# Patient Record
Sex: Male | Born: 1950 | Race: White | Hispanic: No | Marital: Married | Smoking: Never smoker
Health system: Southern US, Community
[De-identification: ages and names within clinical notes are randomized; demographics above are authoritative.]

## PROBLEM LIST (undated history)

## (undated) DIAGNOSIS — T7840XA Allergy, unspecified, initial encounter: Secondary | ICD-10-CM

## (undated) DIAGNOSIS — K222 Esophageal obstruction: Secondary | ICD-10-CM

## (undated) DIAGNOSIS — C4490 Unspecified malignant neoplasm of skin, unspecified: Secondary | ICD-10-CM

## (undated) DIAGNOSIS — F439 Reaction to severe stress, unspecified: Secondary | ICD-10-CM

## (undated) DIAGNOSIS — E785 Hyperlipidemia, unspecified: Secondary | ICD-10-CM

## (undated) DIAGNOSIS — D509 Iron deficiency anemia, unspecified: Secondary | ICD-10-CM

## (undated) DIAGNOSIS — I728 Aneurysm of other specified arteries: Secondary | ICD-10-CM

## (undated) DIAGNOSIS — K219 Gastro-esophageal reflux disease without esophagitis: Secondary | ICD-10-CM

## (undated) DIAGNOSIS — F419 Anxiety disorder, unspecified: Secondary | ICD-10-CM

## (undated) DIAGNOSIS — I10 Essential (primary) hypertension: Secondary | ICD-10-CM

## (undated) DIAGNOSIS — K52839 Microscopic colitis, unspecified: Secondary | ICD-10-CM

## (undated) HISTORY — PX: UPPER GASTROINTESTINAL ENDOSCOPY: SHX188

## (undated) HISTORY — DX: Gastro-esophageal reflux disease without esophagitis: K21.9

## (undated) HISTORY — DX: Iron deficiency anemia, unspecified: D50.9

## (undated) HISTORY — PX: HAND RECONSTRUCTION: SHX1730

## (undated) HISTORY — DX: Allergy, unspecified, initial encounter: T78.40XA

## (undated) HISTORY — DX: Hyperlipidemia, unspecified: E78.5

## (undated) HISTORY — PX: COSMETIC SURGERY: SHX468

## (undated) HISTORY — DX: Esophageal obstruction: K22.2

## (undated) HISTORY — DX: Essential (primary) hypertension: I10

## (undated) HISTORY — DX: Microscopic colitis, unspecified: K52.839

## (undated) HISTORY — DX: Aneurysm of other specified arteries: I72.8

## (undated) HISTORY — DX: Reaction to severe stress, unspecified: F43.9

## (undated) HISTORY — PX: COLONOSCOPY: SHX174

## (undated) HISTORY — DX: Anxiety disorder, unspecified: F41.9

## (undated) HISTORY — PX: OTHER SURGICAL HISTORY: SHX169

## (undated) HISTORY — DX: Unspecified malignant neoplasm of skin, unspecified: C44.90

## (undated) HISTORY — PX: MENISCUS REPAIR: SHX5179

---

## 2009-07-25 ENCOUNTER — Encounter: Payer: Self-pay | Admitting: Internal Medicine

## 2009-08-25 ENCOUNTER — Ambulatory Visit: Payer: Self-pay | Admitting: Internal Medicine

## 2009-08-25 DIAGNOSIS — I10 Essential (primary) hypertension: Secondary | ICD-10-CM | POA: Insufficient documentation

## 2009-08-25 DIAGNOSIS — G4700 Insomnia, unspecified: Secondary | ICD-10-CM | POA: Insufficient documentation

## 2009-08-25 DIAGNOSIS — E785 Hyperlipidemia, unspecified: Secondary | ICD-10-CM | POA: Insufficient documentation

## 2009-10-06 ENCOUNTER — Ambulatory Visit: Payer: Self-pay | Admitting: Internal Medicine

## 2009-12-20 ENCOUNTER — Ambulatory Visit: Payer: Self-pay | Admitting: Internal Medicine

## 2009-12-20 DIAGNOSIS — K219 Gastro-esophageal reflux disease without esophagitis: Secondary | ICD-10-CM | POA: Insufficient documentation

## 2009-12-20 DIAGNOSIS — R109 Unspecified abdominal pain: Secondary | ICD-10-CM | POA: Insufficient documentation

## 2010-02-03 ENCOUNTER — Telehealth: Payer: Self-pay | Admitting: Internal Medicine

## 2010-04-20 ENCOUNTER — Ambulatory Visit: Payer: Self-pay | Admitting: Internal Medicine

## 2010-05-30 NOTE — Assessment & Plan Note (Signed)
Summary: 6 WEEK FOLLOW UP/CJR  in a oneAnd a  Vital Signs:  Patient profile:   60 year old [name] Weight:      202 pounds Temp:     98.1 degrees F oral BP sitting:   102 / 70  (left arm) Cuff size:   regular  Vitals Entered By: Duard Brady LPN (October 06, 1608 8:11 AM) CC: 6 wk rov - doing well Is Patient Diabetic? No   CC:  6 wk rov - doing well.  History of Present Illness: 60 year old patient who is seen today for follow-up of his hypertension.  He was placed on sertraline last visit due to some situational stress anger issues and insomnia.  He has had a traffic clinical response and feels quite well.  He has been on Benicar and in the past has been on Lotensin.  He was changed due to some nonspecific throat issues.  He also remains on Vytorin  Preventive Screening-Counseling & Management  Alcohol-Tobacco     Smoking Status: never  Allergies: 1)  ! Pcn  Past History:  Past Medical History: Reviewed history from 08/25/2009 and no changes required. Hyperlipidemia Hypertension situational stress  Past Surgical History: Reviewed history from 08/25/2009 and no changes required. Cardiolite stress test, fall 2010 colonoscopy upper endoscopy, fall 2010  Review of Systems  The patient denies anorexia, fever, weight loss, weight gain, vision loss, decreased hearing, hoarseness, chest pain, syncope, dyspnea on exertion, peripheral edema, prolonged cough, headaches, hemoptysis, abdominal pain, melena, hematochezia, severe indigestion/heartburn, hematuria, incontinence, genital sores, muscle weakness, suspicious skin lesions, transient blindness, difficulty walking, depression, unusual weight change, abnormal bleeding, enlarged lymph nodes, angioedema, breast masses, and testicular masses.    Physical Exam  General:  Well-developed,well-nourished,in no acute distress; alert,appropriate and cooperative throughout examination;  blood  pressure 104/70   Impression &  Recommendations:  Problem # 1:  INSOMNIA (ICD-780.52)  The following medications were removed from the medication list:    Zolpidem Tartrate 10 Mg Tabs (Zolpidem tartrate) .Marland Kitchen... As needed at bedtime His updated medication list for this problem includes:    Triazolam 0.25 Mg Tabs (Triazolam) ..... One at bedtime as needed for sleep    The following medications were removed from the medication list:    Zolpidem Tartrate 10 Mg Tabs (Zolpidem tartrate) .Marland Kitchen... As needed at bedtime His updated medication list for this problem includes:    Triazolam 0.25 Mg Tabs (Triazolam) ..... One at bedtime as needed for sleep  Problem # 2:  HYPERTENSION (ICD-401.9)  The following medications were removed from the medication list:    Benicar 20 Mg Tabs (Olmesartan medoxomil) ..... Once daily His updated medication list for this problem includes:    Lisinopril 20 Mg Tabs (Lisinopril) ..... One daily    The following medications were removed from the medication list:    Benicar 20 Mg Tabs (Olmesartan medoxomil) ..... Once daily His updated medication list for this problem includes:    Lisinopril 20 Mg Tabs (Lisinopril) ..... One daily  Complete Medication List: 1)  Niaspan 1000 Mg Cr-tabs (Niacin (antihyperlipidemic)) .... Once daily 2)  Aciphex 20 Mg Tbec (Rabeprazole sodium) .... Once daily 3)  Sertraline Hcl 50 Mg Tabs (Sertraline hcl) .... One every am 4)  Lisinopril 20 Mg Tabs (Lisinopril) .... One daily 5)  Triazolam 0.25 Mg Tabs (Triazolam) .... One at bedtime as needed for sleep 6)  Simvastatin 40 Mg Tabs (Simvastatin) .... One daily  Other Orders: Prescription Created Electronically [id number])  Patient Instructions: 1)  Please schedule a follow-up appointment in 6 months. 2)  Limit your Sodium (Salt). 3)  It is important that you exercise regularly at least 20 minutes 5 times a week. If you develop chest pain, have severe difficulty breathing, or feel very tired , stop exercising  immediately and seek medical attention. 4)  Check your Blood Pressure regularly. If it is above: 160/90 you should make an appointment. Prescriptions: TRIAZOLAM 0.25 MG TABS (TRIAZOLAM) one at bedtime as needed for sleep  #30 x 3   Entered and Authorized by:   Gordy Savers  MD   Signed by:   Gordy Savers  MD on 10/06/2009   Method used:   Print then Give to Patient   RxID:   [id number] SIMVASTATIN 40 MG TABS (SIMVASTATIN) one daily  #90 x 6   Entered and Authorized by:   Gordy Savers  MD   Signed by:   Gordy Savers  MD on 10/06/2009   Method used:   Electronically to        [facility]* (retail)       [address]       [place], [place]  [address]       Ph: [phone]       Fax: [phone]   RxID:   [id number] LISINOPRIL 20 MG TABS (LISINOPRIL) one daily  #90 x 6   Entered and Authorized by:   Gordy Savers  MD   Signed by:   Gordy Savers  MD on 10/06/2009   Method used:   Electronically to        [facility]* (retail)       [address]       [place], [place]  [address]       Ph: [phone]       Fax: [phone]   RxID:   [id number] SERTRALINE HCL 50 MG TABS (SERTRALINE HCL) one every am  #90 x 6   Entered and Authorized by:   Gordy Savers  MD   Signed by:   Gordy Savers  MD on 10/06/2009   Method used:   Electronically to        [facility]* (retail)       [address]       [place], [place]  [address]       Ph: [phone]       Fax: [phone]   RxID:   [id number] ACIPHEX 20 MG TBEC (RABEPRAZOLE SODIUM) once daily  #90 x 6   Entered and Authorized by:   Gordy Savers  MD   Signed by:   Gordy Savers  MD on 10/06/2009   Method used:   Electronically to        [facility]* (retail)       [address]       [place], [place]  [address]       Ph: [phone]       Fax: [phone]   RxID:    [id number]

## 2010-05-30 NOTE — Progress Notes (Signed)
Summary: refill halcion  Phone Note Refill Request Message from:  Fax from Pharmacy on February 03, 2010 11:04 AM  Refills Requested: Medication #1:  TRIAZOLAM 0.25 MG TABS one at bedtime as needed for sleep   Last [facility]: [facility]2010 [name] [phone]   Method Requested: Fax to Local Pharmacy Initial call taken by: Duard Brady LPN,  February 03, 2010 11:04 AM    Prescriptions: TRIAZOLAM 0.25 MG TABS (TRIAZOLAM) one at bedtime as needed for sleep  #30 x 3   Entered by:   Duard Brady LPN   Authorized by:   Gordy Savers  MD   Signed by:   Duard Brady LPN on 11/91/4782   Method used:   Historical   RxID:   [id number]  faxed back to pharmacy. KIK

## 2010-05-30 NOTE — Assessment & Plan Note (Signed)
Summary: STOMACH ISSUES // RS   Vital Signs:  Patient profile:   60 year old male Weight:      202 pounds Temp:     98.1 degrees F oral BP sitting:   100 / 70  (right arm) Cuff size:   regular  Vitals Entered By: Duard Brady LPN (December 20, 2009 12:43 PM) CC: c/o stomach issues Is Patient Diabetic? No   CC:  c/o stomach issues.  History of Present Illness: 60 year old patient who has a history of gastroesophageal reflux disease complicated by stricture formation.  He underwent colonoscopy, as well as upper panendoscopy in October of last year.  For the past 4 to 6 months.  He has had some very nonspecific epigastric and right upper quadrant fullness.  He describes very active bowel sounds. He does not feel these are his typical reflux symptoms.  He did have an abdominal ultrasound performed in October as well.  It revealed fatty liver only.  There's been no weight loss, painful swallowing, change in bowel habits, etc. he does have treated hypertension, which has been stable.  He has dyslipidemia  Allergies: 1)  ! Pcn  Past History:  Past Medical History: Hyperlipidemia Hypertension situational stress GERD/stricture  Past Surgical History: Reviewed history from 08/25/2009 and no changes required. Cardiolite stress test, fall 2010 colonoscopy upper endoscopy, fall 2010  Review of Systems       The patient complains of abdominal pain.  The patient denies anorexia, fever, weight loss, weight gain, vision loss, decreased hearing, hoarseness, chest pain, syncope, dyspnea on exertion, peripheral edema, prolonged cough, headaches, hemoptysis, melena, hematochezia, severe indigestion/heartburn, hematuria, incontinence, genital sores, muscle weakness, suspicious skin lesions, transient blindness, difficulty walking, depression, unusual weight change, abnormal bleeding, enlarged lymph nodes, angioedema, breast masses, and testicular masses.    Physical Exam  General:   overweight-appearing.  normal blood pressureoverweight-appearing.   Head:  Normocephalic and atraumatic without obvious abnormalities. No apparent alopecia or balding. Eyes:  No corneal or conjunctival inflammation noted. EOMI. Perrla. Funduscopic exam benign, without hemorrhages, exudates or papilledema. Vision grossly normal. Mouth:  Oral mucosa and oropharynx without lesions or exudates.  Teeth in good repair. Neck:  No deformities, masses, or tenderness noted. Lungs:  Normal respiratory effort, chest expands symmetrically. Lungs are clear to auscultation, no crackles or wheezes. Heart:  Normal rate and regular rhythm. S1 and S2 normal without gallop, murmur, click, rub or other extra sounds. Abdomen:  Bowel sounds positive,abdomen soft and non-tender without masses, organomegaly or hernias noted.   Impression & Recommendations:  Problem # 1:  ABDOMINAL PAIN (ICD-789.00) this is very nonspecific very mild and chronic.  Within the past 10 months.  He has had lab abdominal ultrasound and upper and lower endoscopies.  Options were discussed.  Had elected for active surveillance.  He will call to is any change in his symptoms  Problem # 2:  GERD (ICD-530.81)  His updated medication list for this problem includes:    Aciphex 20 Mg Tbec (Rabeprazole sodium) ..... Once daily  His updated medication list for this problem includes:    Aciphex 20 Mg Tbec (Rabeprazole sodium) ..... Once daily  Problem # 3:  HYPERTENSION (ICD-401.9)  His updated medication list for this problem includes:    Lisinopril 20 Mg Tabs (Lisinopril) ..... One daily  His updated medication list for this problem includes:    Lisinopril 20 Mg Tabs (Lisinopril) ..... One daily  Complete Medication List: 1)  Niaspan 1000 Mg Cr-tabs (Niacin (antihyperlipidemic)) .Marland KitchenMarland KitchenMarland Kitchen  Once daily 2)  Aciphex 20 Mg Tbec (Rabeprazole sodium) .... Once daily 3)  Sertraline Hcl 50 Mg Tabs (Sertraline hcl) .... One every am 4)  Lisinopril 20 Mg  Tabs (Lisinopril) .... One daily 5)  Triazolam 0.25 Mg Tabs (Triazolam) .... One at bedtime as needed for sleep 6)  Simvastatin 40 Mg Tabs (Simvastatin) .... One daily  Patient Instructions: 1)  CPX  in the spring of next year 2)  Limit your Sodium (Salt) to less than 2 grams a day(slightly less than 1/2 a teaspoon) to prevent fluid retention, swelling, or worsening of symptoms. 3)  Avoid foods high in acid (tomatoes, citrus juices, spicy foods). Avoid eating within two hours of lying down or before exercising. Do not over eat; try smaller more frequent meals. Elevate head of bed twelve inches when sleeping. 4)  It is important that you exercise regularly at least 20 minutes 5 times a week. If you develop chest pain, have severe difficulty breathing, or feel very tired , stop exercising immediately and seek medical attention. 5)  You need to lose weight. Consider a lower calorie diet and regular exercise.

## 2010-05-30 NOTE — Assessment & Plan Note (Signed)
Summary: BRAND NEW PT/TO EST/CJR   Vital Signs:  Patient profile:   60 year old male Height:      69.5 inches (176.53 cm) Weight:      200 pounds (90.91 kg) BMI:     29.22 Temp:     98.5 degrees F (36.94 degrees C) oral BP sitting:   110 / 68  (right arm) Cuff size:   regular  Vitals Entered By: Duard Brady LPN (August 25, 2009 3:04 PM) CC: new patient to establish Is Patient Diabetic? No   CC:  new patient to establish.  History of Present Illness: 60 year old patient who is seen today to reassess this with our practice.  He is a former Building control surveyor, internal medicine.  Patient.  He is treated hypertension, and dyslipidemia.  Complaints today include significant situational stress at work.  This is an affair, and was sleeping and causing anxiety and irritability  Preventive Screening-Counseling & Management  Alcohol-Tobacco     Smoking Status: never  Allergies (verified): 1)  ! Pcn  Past History:  Past Medical History: Hyperlipidemia Hypertension situational stress  Past Surgical History: Cardiolite stress test, fall 2010 colonoscopy upper endoscopy, fall 2010  Family History: Reviewed history and no changes required. father died age 3.  Lymphoma mother died age 46, lung cancer one sister with coronary artery disease  Social History: Reviewed history and no changes required. 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    Physical Exam  General:  Well-developed,well-nourished,in no acute distress; alert,appropriate and cooperative throughout examination Head:  Normocephalic and atraumatic without obvious abnormalities. No apparent alopecia or balding. Eyes:  No corneal or conjunctival inflammation noted. EOMI. Perrla. Funduscopic exam benign, without hemorrhages, exudates or papilledema. Vision grossly normal. Ears:  External ear exam shows no significant lesions or deformities.  Otoscopic examination reveals clear canals, tympanic membranes are intact bilaterally without bulging, retraction, inflammation or discharge. Hearing is grossly normal bilaterally. Nose:  External nasal examination shows no deformity or inflammation. Nasal mucosa are pink and moist without lesions or exudates. Mouth:  Oral mucosa and oropharynx without lesions or exudates.  Teeth in good repair. Neck:  No deformities, masses, or tenderness noted. Chest Wall:  No deformities, masses, tenderness or gynecomastia noted. Breasts:  No masses or gynecomastia noted Lungs:  Normal respiratory effort, chest expands symmetrically. Lungs are clear to auscultation, no crackles or wheezes. Heart:  Normal rate and regular rhythm. S1 and S2 normal without gallop, murmur, click, rub or other extra sounds. Abdomen:  Bowel sounds positive,abdomen soft and non-tender without masses, organomegaly or hernias noted. Genitalia:  Testes bilaterally descended without nodularity, tenderness or masses. No scrotal masses or lesions. No penis lesions or urethral discharge. Msk:  No deformity or scoliosis noted of thoracic or lumbar spine.   Pulses:  R and L carotid,radial,femoral,dorsalis pedis and posterior tibial pulses are full and equal bilaterally Extremities:  No clubbing, cyanosis, edema, or deformity noted with normal full range of motion of all joints.   Neurologic:  No cranial nerve deficits noted. Station  and gait are normal. Plantar  reflexes are down-going bilaterally. DTRs are symmetrical throughout. Sensory, motor and coordinative functions appear intact. Skin:  Intact without suspicious lesions or rashes Cervical Nodes:  No lymphadenopathy noted Axillary Nodes:  No palpable lymphadenopathy Inguinal Nodes:  No significant adenopathy Psych:  Cognition and judgment appear intact. Alert and cooperative with normal attention span and concentration. No apparent delusions, illusions, hallucinations   Impression & Recommendations:  Problem # 1:  HYPERTENSION (ICD-401.9)  His updated medication list for this problem includes:    Benicar 20 Mg Tabs (Olmesartan medoxomil) ..... Once daily  Problem # 2:  HYPERLIPIDEMIA (ICD-272.4)  His updated medication list for this problem includes:    Niaspan 1000 Mg Cr-tabs (Niacin (antihyperlipidemic)) ..... Once daily  Problem # 3:  INSOMNIA (ICD-780.52)  His updated medication list for this problem includes:    Zolpidem Tartrate 10 Mg Tabs (Zolpidem tartrate) .Marland Kitchen... As needed at bedtime  Complete Medication List: 1)  Benicar 20 Mg Tabs (Olmesartan medoxomil) .... Once daily 2)  Vytorin  .... Once daily 3)  Niaspan 1000 Mg Cr-tabs (Niacin (antihyperlipidemic)) .... Once daily 4)  Aciphex 20 Mg Tbec (Rabeprazole sodium) .... Once daily 5)  Zolpidem Tartrate 10 Mg Tabs (Zolpidem tartrate) .... As needed at bedtime 6)  Sertraline Hcl 50 Mg Tabs (Sertraline hcl) .... One every am  Patient Instructions: 1)  Please schedule a follow-up appointment in 6 weeks 2)  Limit your Sodium (Salt). 3)  It is important that you exercise regularly at least 20 minutes 5 times a week. If you develop chest pain, have severe difficulty breathing, or feel very tired , stop exercising immediately and seek medical attention. 4)  Check your Blood Pressure regularly. If it is above: 150/90  you should make an appointment. Prescriptions: SERTRALINE HCL 50 MG TABS (SERTRALINE  HCL) one every am  #90 x 2   Entered and Authorized by:   Gordy Savers  MD   Signed by:   Gordy Savers  MD on 08/25/2009   Method used:   Electronically to        [facility]* (retail)       [address]       [place], [place]  [address]       Ph: [phone]       Fax: [phone]   RxID:   [id number]   Appended Document: BRAND NEW PT/TO EST/CJR Dx;  GERD Ops: T&A  [id number]

## 2010-05-30 NOTE — Letter (Signed)
Summary: Records from [facility] 2009 - 2011  Records from [facility] 2009 - 2011   Imported By: Maryln Gottron 09/08/2009 13:23:26  _____________________________________________________________________  External Attachment:    Type:   Image     Comment:   External Document

## 2010-06-01 NOTE — Assessment & Plan Note (Signed)
Summary: 6 month rov/njr rsc bmp/njr   Vital Signs:  Patient profile:   60 year old male Weight:      205 pounds Temp:     98.0 degrees F oral BP sitting:   120 / 80  (right arm) Cuff size:   regular  Vitals Entered By: Duard Brady LPN (April 20, 2010 12:03 PM) CC: 6 mos rov 0 doing ok Is Patient Diabetic? No   CC:  6 mos rov 0 doing ok.  History of Present Illness: 31 -year-old patient who is seen today for follow-up of hypertension.  He has dyslipidemia.  He is doing quite well and is seen today for his 6 month follow-up.  His only complaint is left knee pain after a strain yesterday.  He continues to tolerate his medication well.  No compliance issues.  Allergies: 1)  ! Pcn  Past History:  Past Medical History: Reviewed history from 12/20/2009 and no changes required. Hyperlipidemia Hypertension situational stress GERD/stricture  Past Surgical History: Reviewed history from 08/25/2009 and no changes required. Cardiolite stress test, fall 2010 colonoscopy upper endoscopy, fall 2010  Review of Systems  The patient denies anorexia, fever, weight loss, weight gain, vision loss, decreased hearing, hoarseness, chest pain, syncope, dyspnea on exertion, peripheral edema, prolonged cough, headaches, hemoptysis, abdominal pain, melena, hematochezia, severe indigestion/heartburn, hematuria, incontinence, genital sores, muscle weakness, suspicious skin lesions, transient blindness, difficulty walking, depression, unusual weight change, abnormal bleeding, enlarged lymph nodes, angioedema, breast masses, and testicular masses.    Physical Exam  General:  overweight-appearing.  110/70 Head:  Normocephalic and atraumatic without obvious abnormalities. No apparent alopecia or balding. Eyes:  No corneal or conjunctival inflammation noted. EOMI. Perrla. Funduscopic exam benign, without hemorrhages, exudates or papilledema. Vision grossly normal. Mouth:  Oral mucosa and  oropharynx without lesions or exudates.  Teeth in good repair. Neck:  No deformities, masses, or tenderness noted. Lungs:  Normal respiratory effort, chest expands symmetrically. Lungs are clear to auscultation, no crackles or wheezes. Heart:  Normal rate and regular rhythm. S1 and S2 normal without gallop, murmur, click, rub or other extra sounds. Abdomen:  Bowel sounds positive,abdomen soft and non-tender without masses, organomegaly or hernias noted. Msk:  No deformity or scoliosis noted of thoracic or lumbar spine.     Impression & Recommendations:  Problem # 1:  HYPERTENSION (ICD-401.9)  His updated medication list for this problem includes:    Lisinopril 20 Mg Tabs (Lisinopril) ..... One daily  Problem # 2:  HYPERLIPIDEMIA (ICD-272.4)  His updated medication list for this problem includes:    Niaspan 1000 Mg Cr-tabs (Niacin (antihyperlipidemic)) ..... Once daily    Simvastatin 40 Mg Tabs (Simvastatin) ..... One daily  Complete Medication List: 1)  Niaspan 1000 Mg Cr-tabs (Niacin (antihyperlipidemic)) .... Once daily 2)  Aciphex 20 Mg Tbec (Rabeprazole sodium) .... Once daily 3)  Sertraline Hcl 50 Mg Tabs (Sertraline hcl) .... One every am 4)  Lisinopril 20 Mg Tabs (Lisinopril) .... One daily 5)  Triazolam 0.25 Mg Tabs (Triazolam) .... One at bedtime as needed for sleep 6)  Simvastatin 40 Mg Tabs (Simvastatin) .... One daily  Patient Instructions: 1)  Please schedule a follow-up appointment in 6 months for annual exam  2)  Limit your Sodium (Salt) to less than 2 grams a day(slightly less than 1/2 a teaspoon) to prevent fluid retention, swelling, or worsening of symptoms. 3)  It is important that you exercise regularly at least 20 minutes 5 times a week. If you develop chest  pain, have severe difficulty breathing, or feel very tired , stop exercising immediately and seek medical attention. 4)  You need to lose weight. Consider a lower calorie diet and regular exercise.    Prescriptions: SIMVASTATIN 40 MG TABS (SIMVASTATIN) one daily  #90 x 6   Entered and Authorized by:   Gordy Savers  MD   Signed by:   Gordy Savers  MD on 04/20/2010   Method used:   Print then Give to Patient   RxID:   [id number] TRIAZOLAM 0.25 MG TABS (TRIAZOLAM) one at bedtime as needed for sleep  #30 x 3   Entered and Authorized by:   Gordy Savers  MD   Signed by:   Gordy Savers  MD on 04/20/2010   Method used:   Print then Give to Patient   RxID:   [id number] LISINOPRIL 20 MG TABS (LISINOPRIL) one daily  #90 x 6   Entered and Authorized by:   Gordy Savers  MD   Signed by:   Gordy Savers  MD on 04/20/2010   Method used:   Print then Give to Patient   RxID:   [id number] SERTRALINE HCL 50 MG TABS (SERTRALINE HCL) one every am  #90 x 6   Entered and Authorized by:   Gordy Savers  MD   Signed by:   Gordy Savers  MD on 04/20/2010   Method used:   Print then Give to Patient   RxID:   [id number] ACIPHEX 20 MG TBEC (RABEPRAZOLE SODIUM) once daily  #90 x 6   Entered and Authorized by:   Gordy Savers  MD   Signed by:   Gordy Savers  MD on 04/20/2010   Method used:   Print then Give to Patient   RxID:   [id number]    Orders Added: 1)  Est. Patient Level III [66063]

## 2010-06-14 ENCOUNTER — Telehealth: Payer: Self-pay | Admitting: *Deleted

## 2010-06-14 NOTE — Telephone Encounter (Signed)
Pt is going on a hunting trip to [place] and wants meds for nausea and vomiting and antibiotic, and diarrhea.   Will need to pick up Thursday afternoon.

## 2010-06-15 ENCOUNTER — Other Ambulatory Visit: Payer: Self-pay

## 2010-06-15 MED ORDER — DIPHENOXYLATE-ATROPINE 2.5-0.025 MG PO TABS: 1.0000 | ORAL_TABLET | Freq: Four times a day (QID) | ORAL | Status: AC | PRN

## 2010-06-15 MED ORDER — PROMETHAZINE HCL 25 MG PO TABS: 25.0000 mg | ORAL_TABLET | Freq: Four times a day (QID) | ORAL | Status: DC | PRN

## 2010-06-15 MED ORDER — PROMETHAZINE HCL 25 MG PO TABS: 25.0000 mg | ORAL_TABLET | Freq: Four times a day (QID) | ORAL | Status: AC | PRN

## 2010-06-15 MED ORDER — CIPROFLOXACIN HCL 500 MG PO TABS: 500.0000 mg | ORAL_TABLET | Freq: Two times a day (BID) | ORAL | Status: DC

## 2010-06-15 NOTE — Telephone Encounter (Signed)
Generic cipro 500 #14; phenergan 25  #12; generic lomotil 320

## 2010-10-02 ENCOUNTER — Other Ambulatory Visit: Payer: Self-pay

## 2010-10-02 MED ORDER — TRIAZOLAM 0.25 MG PO TABS: 0.2500 mg | ORAL_TABLET | Freq: Every evening | ORAL | Status: DC | PRN

## 2010-10-02 NOTE — Telephone Encounter (Signed)
[facility]

## 2010-11-10 ENCOUNTER — Other Ambulatory Visit: Payer: Self-pay

## 2010-11-10 MED ORDER — SIMVASTATIN 40 MG PO TABS: 40.0000 mg | ORAL_TABLET | Freq: Every evening | ORAL | Status: DC

## 2010-11-27 ENCOUNTER — Other Ambulatory Visit: Payer: Self-pay

## 2010-11-27 MED ORDER — SERTRALINE HCL 50 MG PO TABS: 50.0000 mg | ORAL_TABLET | Freq: Every day | ORAL | Status: DC

## 2010-11-27 NOTE — Telephone Encounter (Signed)
rx sent to pharmacy

## 2010-11-29 ENCOUNTER — Other Ambulatory Visit: Payer: Self-pay

## 2010-11-29 MED ORDER — RABEPRAZOLE SODIUM 20 MG PO TBEC: 20.0000 mg | DELAYED_RELEASE_TABLET | Freq: Every day | ORAL | Status: DC

## 2010-11-29 MED ORDER — LISINOPRIL 20 MG PO TABS: 20.0000 mg | ORAL_TABLET | Freq: Every day | ORAL | Status: DC

## 2010-12-11 ENCOUNTER — Other Ambulatory Visit: Payer: Self-pay

## 2010-12-11 NOTE — Telephone Encounter (Signed)
Ok to RF  #90  RF 5

## 2010-12-11 NOTE — Telephone Encounter (Signed)
[facility]ill [facility] for Ibuprofen 800mg  [facility] Informed dr.kwiatkowski out of town , this was rx'd from another doctor before we started seeing pt - was a newpt to [place] 07/2009 , this was written 06/2009 Please advise

## 2010-12-12 MED ORDER — IBUPROFEN 800 MG PO TABS: 800.0000 mg | ORAL_TABLET | Freq: Three times a day (TID) | ORAL | Status: AC | PRN

## 2010-12-12 NOTE — Telephone Encounter (Signed)
Faxed back to [facility].

## 2011-01-02 ENCOUNTER — Other Ambulatory Visit: Payer: Self-pay

## 2011-01-02 MED ORDER — TRIAZOLAM 0.25 MG PO TABS: 0.2500 mg | ORAL_TABLET | Freq: Every evening | ORAL | Status: DC | PRN

## 2011-01-02 NOTE — Telephone Encounter (Signed)
[facility] 

## 2011-01-17 ENCOUNTER — Other Ambulatory Visit: Payer: Self-pay

## 2011-01-17 MED ORDER — NIACIN ER (ANTIHYPERLIPIDEMIC) 1000 MG PO TBCR: 1000.0000 mg | EXTENDED_RELEASE_TABLET | Freq: Every day | ORAL | Status: DC

## 2011-01-17 NOTE — Telephone Encounter (Signed)
Faxed back to pharmacy

## 2011-03-13 ENCOUNTER — Telehealth: Payer: Self-pay | Admitting: Internal Medicine

## 2011-03-13 NOTE — Telephone Encounter (Signed)
Pt wanted an order put in for labs for a med check up so he could go to a [facility]. Pt requesting you contact him

## 2011-03-13 NOTE — Telephone Encounter (Signed)
Spoke with pt - informed will write in rx and fax to him - cpx labs - ua, cbc with diff, cmet, lipid , hepatic, tsh, psa

## 2011-04-30 ENCOUNTER — Other Ambulatory Visit: Payer: Self-pay

## 2011-04-30 MED ORDER — TRIAZOLAM 0.25 MG PO TABS: 0.2500 mg | ORAL_TABLET | Freq: Every evening | ORAL | Status: DC | PRN

## 2011-04-30 NOTE — Telephone Encounter (Signed)
Will need to be seen - last seen 03/2010 will give #30

## 2011-05-10 ENCOUNTER — Encounter: Payer: Self-pay | Admitting: Internal Medicine

## 2011-05-10 ENCOUNTER — Ambulatory Visit (INDEPENDENT_AMBULATORY_CARE_PROVIDER_SITE_OTHER): Payer: BC Managed Care – PPO | Admitting: Internal Medicine

## 2011-05-10 DIAGNOSIS — I10 Essential (primary) hypertension: Secondary | ICD-10-CM

## 2011-05-10 DIAGNOSIS — E785 Hyperlipidemia, unspecified: Secondary | ICD-10-CM

## 2011-05-10 MED ORDER — SIMVASTATIN 40 MG PO TABS: 40.0000 mg | ORAL_TABLET | Freq: Every evening | ORAL | Status: DC

## 2011-05-10 MED ORDER — NIACIN ER (ANTIHYPERLIPIDEMIC) 1000 MG PO TBCR: 1000.0000 mg | EXTENDED_RELEASE_TABLET | Freq: Every day | ORAL | Status: DC

## 2011-05-10 MED ORDER — SERTRALINE HCL 50 MG PO TABS: 50.0000 mg | ORAL_TABLET | Freq: Every day | ORAL | Status: DC

## 2011-05-10 MED ORDER — TRIAZOLAM 0.25 MG PO TABS: 0.2500 mg | ORAL_TABLET | Freq: Every evening | ORAL | Status: DC | PRN

## 2011-05-10 MED ORDER — RABEPRAZOLE SODIUM 20 MG PO TBEC: 20.0000 mg | DELAYED_RELEASE_TABLET | Freq: Every day | ORAL | Status: DC

## 2011-05-10 MED ORDER — LISINOPRIL 20 MG PO TABS: 20.0000 mg | ORAL_TABLET | Freq: Every day | ORAL | Status: DC

## 2011-05-10 NOTE — Patient Instructions (Signed)
Limit your sodium (Salt) intake    It is important that you exercise regularly, at least 20 minutes 3 to 4 times per week.  If you develop chest pain or shortness of breath seek  medical attention.  Please check your blood pressure on a regular basis.  If it is consistently greater than 150/90, please make an office appointment.  Return in one year for follow-up  

## 2011-05-10 NOTE — Progress Notes (Signed)
  Subjective:    Patient ID: Steven [name], male    DOB: 1950/08/01, 61 y.o.   MRN: [id number]  HPI    61 year old patient who has a history of hypertension and dyslipidemia. He did have laboratory studies that were done recently. Medical regimen includes simvastatin 40 mg daily he has done quite well. No concerns or complaints.    Review of Systems  Constitutional: Negative for fever, chills, appetite change and fatigue.  HENT: Negative for hearing loss, ear pain, congestion, sore throat, trouble swallowing, neck stiffness, dental problem, voice change and tinnitus.   Eyes: Negative for pain, discharge and visual disturbance.  Respiratory: Negative for cough, chest tightness, wheezing and stridor.   Cardiovascular: Negative for chest pain, palpitations and leg swelling.  Gastrointestinal: Negative for nausea, vomiting, abdominal pain, diarrhea, constipation, blood in stool and abdominal distention.  Genitourinary: Negative for urgency, hematuria, flank pain, discharge, difficulty urinating and genital sores.  Musculoskeletal: Negative for myalgias, back pain, joint swelling, arthralgias and gait problem.  Skin: Negative for rash.  Neurological: Negative for dizziness, syncope, speech difficulty, weakness, numbness and headaches.  Hematological: Negative for adenopathy. Does not bruise/bleed easily.  Psychiatric/Behavioral: Positive for sleep disturbance. Negative for behavioral problems and dysphoric mood. The patient is not nervous/anxious.        Objective:   Physical Exam  Constitutional: He is oriented to person, place, and time. He appears well-developed.  HENT:  Head: Normocephalic.  Right Ear: External ear normal.  Left Ear: External ear normal.  Eyes: Conjunctivae and EOM are normal.  Neck: Normal range of motion.  Cardiovascular: Normal rate and normal heart sounds.   Pulmonary/Chest: Breath sounds normal.  Abdominal: Bowel sounds are normal.  Musculoskeletal: Normal  range of motion. He exhibits no edema and no tenderness.  Neurological: He is alert and oriented to person, place, and time.  Psychiatric: He has a normal mood and affect. His behavior is normal.          Assessment & Plan    Hypertension well controlled Dyslipidemia stable  We'll schedule for complete physical in 1 year. Medical regimen unchanged. Medications refilled

## 2011-05-23 ENCOUNTER — Telehealth: Payer: Self-pay | Admitting: Family Medicine

## 2011-05-30 ENCOUNTER — Telehealth: Payer: Self-pay | Admitting: Family Medicine

## 2011-05-30 NOTE — Telephone Encounter (Signed)
error 

## 2011-05-30 NOTE — Telephone Encounter (Signed)
Error

## 2011-06-12 ENCOUNTER — Telehealth: Payer: Self-pay

## 2011-06-12 NOTE — Telephone Encounter (Signed)
Please send pre auth for aciphex with current insurance per pt request - will be alittle while before new ins will take over and needs meds.

## 2011-06-13 NOTE — Telephone Encounter (Signed)
Attempt to call- VM - informed I need to know if he has tried any other PPI's - need to record  How long and failure of tx. If not - need to order one to try before aciphex will be auth'd.

## 2011-06-13 NOTE — Telephone Encounter (Signed)
I can do that. However, with Aciphex, a preferred drug MUST be tried first. Those include: omeprazole, pantoprazole, Nexium, or lansoprazole. If my memory serves, he has not tried anything else. Please verify this, and if he has not, Aciphex prior auth will not be approved until he does. I will hold PA paperwork til I hear from you. Thanks!

## 2011-06-18 ENCOUNTER — Telehealth: Payer: Self-pay | Admitting: Family Medicine

## 2011-06-18 MED ORDER — OMEPRAZOLE 40 MG PO CPDR: 40.0000 mg | DELAYED_RELEASE_CAPSULE | Freq: Every day | ORAL | Status: DC

## 2011-06-18 NOTE — Telephone Encounter (Signed)
Omeprazole called in Pt aware - there are no other PPI's he has tried.

## 2011-06-18 NOTE — Telephone Encounter (Signed)
He called and spoke with you about Aciphex Rx. States that a generic was to be called in, because of prior [place]. Patient says this has not happened. Please call pt ASAP. He is completely out of meds. Please call to [facility] in [place]. Ph# [phone]. Call pt when done.

## 2011-08-20 ENCOUNTER — Other Ambulatory Visit: Payer: Self-pay

## 2011-08-20 NOTE — Telephone Encounter (Signed)
Error

## 2011-10-09 ENCOUNTER — Other Ambulatory Visit: Payer: Self-pay | Admitting: Internal Medicine

## 2011-10-30 ENCOUNTER — Other Ambulatory Visit: Payer: Self-pay

## 2011-10-30 MED ORDER — LISINOPRIL 20 MG PO TABS: 20.0000 mg | ORAL_TABLET | Freq: Every day | ORAL | Status: DC

## 2011-10-31 ENCOUNTER — Other Ambulatory Visit: Payer: Self-pay

## 2011-10-31 MED ORDER — TRIAZOLAM 0.25 MG PO TABS: 0.2500 mg | ORAL_TABLET | Freq: Every evening | ORAL | Status: DC | PRN

## 2011-10-31 NOTE — Telephone Encounter (Signed)
called to [facility] [place]

## 2011-12-18 ENCOUNTER — Telehealth: Payer: Self-pay | Admitting: Internal Medicine

## 2011-12-18 NOTE — Telephone Encounter (Signed)
Spoke with pt- informed ok to do labs fasting before appt - transferred to scheduling to set up lab appt then cpx a few wks later

## 2011-12-18 NOTE — Telephone Encounter (Signed)
Pt called and is req call back from Selena Batten re: getting some special labs order for cpx. Pt said that he wanted to discuss what he needs to get done, prior to coming for ov.

## 2011-12-21 ENCOUNTER — Other Ambulatory Visit (INDEPENDENT_AMBULATORY_CARE_PROVIDER_SITE_OTHER): Payer: BC Managed Care – PPO

## 2011-12-21 DIAGNOSIS — Z79899 Other long term (current) drug therapy: Secondary | ICD-10-CM

## 2011-12-21 DIAGNOSIS — Z0000 Encounter for general adult medical examination without abnormal findings: Secondary | ICD-10-CM

## 2011-12-21 LAB — POCT URINALYSIS DIPSTICK
Ketones, UA: NEGATIVE
Leukocytes, UA: NEGATIVE
Protein, UA: NEGATIVE
Urobilinogen, UA: 0.2

## 2011-12-21 LAB — CBC WITH DIFFERENTIAL/PLATELET
Basophils Absolute: 0 10*3/uL (ref 0.0–0.1)
Eosinophils Absolute: 0.1 10*3/uL (ref 0.0–0.7)
Hemoglobin: 15.6 g/dL (ref 13.0–17.0)
Lymphocytes Relative: 34.2 % (ref 12.0–46.0)
MCHC: 33.4 g/dL (ref 30.0–36.0)
Neutro Abs: 3 10*3/uL (ref 1.4–7.7)
Neutrophils Relative %: 54 % (ref 43.0–77.0)
Platelets: 213 10*3/uL (ref 150.0–400.0)
RDW: 12.7 % (ref 11.5–14.6)

## 2011-12-21 LAB — LDL CHOLESTEROL, DIRECT: Direct LDL: 171 mg/dL

## 2011-12-21 LAB — BASIC METABOLIC PANEL
BUN: 13 mg/dL (ref 6–23)
CO2: 27 mEq/L (ref 19–32)
Calcium: 9.9 mg/dL (ref 8.4–10.5)
Creatinine, Ser: 0.9 mg/dL (ref 0.4–1.5)
Glucose, Bld: 114 mg/dL — ABNORMAL HIGH (ref 70–99)

## 2011-12-21 LAB — HEPATIC FUNCTION PANEL
Albumin: 4.6 g/dL (ref 3.5–5.2)
Alkaline Phosphatase: 80 U/L (ref 39–117)

## 2011-12-21 LAB — TESTOSTERONE: Testosterone: 326.73 ng/dL — ABNORMAL LOW (ref 350.00–890.00)

## 2011-12-21 LAB — TSH: TSH: 1.7 u[IU]/mL (ref 0.35–5.50)

## 2011-12-24 ENCOUNTER — Encounter: Payer: Self-pay | Admitting: Internal Medicine

## 2011-12-24 ENCOUNTER — Ambulatory Visit (INDEPENDENT_AMBULATORY_CARE_PROVIDER_SITE_OTHER): Payer: BC Managed Care – PPO | Admitting: Internal Medicine

## 2011-12-24 VITALS — BP 100/70 | HR 70 | Temp 98.0°F | Resp 16 | Ht 70.5 in | Wt 208.0 lb

## 2011-12-24 DIAGNOSIS — R7309 Other abnormal glucose: Secondary | ICD-10-CM

## 2011-12-24 DIAGNOSIS — R7303 Prediabetes: Secondary | ICD-10-CM | POA: Insufficient documentation

## 2011-12-24 DIAGNOSIS — I10 Essential (primary) hypertension: Secondary | ICD-10-CM

## 2011-12-24 DIAGNOSIS — E785 Hyperlipidemia, unspecified: Secondary | ICD-10-CM

## 2011-12-24 DIAGNOSIS — Z0000 Encounter for general adult medical examination without abnormal findings: Secondary | ICD-10-CM

## 2011-12-24 DIAGNOSIS — R7302 Impaired glucose tolerance (oral): Secondary | ICD-10-CM

## 2011-12-24 LAB — HEMOGLOBIN A1C: Hgb A1c MFr Bld: 5.7 % (ref 4.6–6.5)

## 2011-12-24 LAB — HEPATITIS C ANTIBODY: HCV Ab: NEGATIVE

## 2011-12-24 MED ORDER — SIMVASTATIN 40 MG PO TABS: 40.0000 mg | ORAL_TABLET | Freq: Every evening | ORAL | Status: DC

## 2011-12-24 MED ORDER — SERTRALINE HCL 50 MG PO TABS: 50.0000 mg | ORAL_TABLET | Freq: Every day | ORAL | Status: DC

## 2011-12-24 MED ORDER — LISINOPRIL 20 MG PO TABS: 20.0000 mg | ORAL_TABLET | Freq: Every day | ORAL | Status: DC

## 2011-12-24 MED ORDER — OMEPRAZOLE 40 MG PO CPDR: 40.0000 mg | DELAYED_RELEASE_CAPSULE | Freq: Every day | ORAL | Status: DC

## 2011-12-24 NOTE — Progress Notes (Signed)
Subjective:    Patient ID: Steven [name], male    DOB: [birth year], 61 y.o.   MRN: [id number]  HPI 61 year old patient who is seen today for a health maintenance exam. He has a history of hypertension dyslipidemia and mild impaired glucose tolerance. He has gastro-esophageal reflux disease. He is doing quite well today. No concerns or complaints.  Past Medical History  Diagnosis Date  . Hyperlipidemia   . Hypertension   . Situational stress   . GERD with stricture     History   Social History  . Marital Status: Single    Spouse Name: N/A    Number of Children: N/A  . Years of Education: N/A   Occupational History  . Not on file.   Social History Main Topics  . Smoking status: Never Smoker   . Smokeless tobacco: Never Used  . Alcohol Use: Yes  . Drug Use: No  . Sexually Active: Not on file   Other Topics Concern  . Not on file   Social History Narrative  . No narrative on file    Past Surgical History  Procedure Date  . Cardiolite stresstest fall 2010  . Colonoscopy fall 2010  . Upper gastrointestinal endoscopy fall 2010    Family History  Problem Relation Age of Onset  . Cancer Mother     lung   . Lymphoma Father   . Coronary artery disease Sister     Allergies  Allergen Reactions  . Penicillins     Current Outpatient Prescriptions on File Prior to Visit  Medication Sig Dispense Refill  . lisinopril (PRINIVIL,ZESTRIL) 20 MG tablet Take 1 tablet (20 mg total) by mouth daily.  90 tablet  1  . niacin (NIASPAN) 1000 MG CR tablet Take 1 tablet (1,000 mg total) by mouth at bedtime.  90 tablet  1  . omeprazole (PRILOSEC) 40 MG capsule TAKE ONE CAPSULE BY MOUTH EVERY DAY  30 capsule  5  . RABEprazole (ACIPHEX) 20 MG tablet Take 1 tablet (20 mg total) by mouth daily.  90 tablet  2  . sertraline (ZOLOFT) 50 MG tablet Take 1 tablet (50 mg total) by mouth daily.  90 tablet  1  . simvastatin (ZOCOR) 40 MG tablet Take 1 tablet (40 mg total) by mouth every evening.   90 tablet  2  . triazolam (HALCION) 0.25 MG tablet Take 1 tablet (0.25 mg total) by mouth at bedtime as needed.  30 tablet  3    BP 100/70  Pulse 70  Temp 98 F (36.7 C) (Oral)  Resp 16  Ht 5' 10.5" (1.791 m)  Wt 208 lb (94.348 kg)  BMI 29.42 kg/m2  SpO2 97%   Past Medical History:  Reviewed history from 12/20/2009 and no changes required.  Hyperlipidemia  Hypertension  situational stress  GERD/stricture   Past Surgical History:  Reviewed history from 08/25/2009 and no changes required.  Cardiolite stress test, fall 2010  colonoscopy upper endoscopy, fall 2010     Review of Systems  Constitutional: Negative for fever, chills, activity change, appetite change and fatigue.  HENT: Negative for hearing loss, ear pain, congestion, rhinorrhea, sneezing, mouth sores, trouble swallowing, neck pain, neck stiffness, dental problem, voice change, sinus pressure and tinnitus.   Eyes: Negative for photophobia, pain, redness and visual disturbance.  Respiratory: Negative for apnea, cough, choking, chest tightness, shortness of breath and wheezing.   Cardiovascular: Negative for chest pain, palpitations and leg swelling.  Gastrointestinal: Negative for nausea, vomiting, abdominal pain,  diarrhea, constipation, blood in stool, abdominal distention, anal bleeding and rectal pain.  Genitourinary: Negative for dysuria, urgency, frequency, hematuria, flank pain, decreased urine volume, discharge, penile swelling, scrotal swelling, difficulty urinating, genital sores and testicular pain.  Musculoskeletal: Negative for myalgias, back pain, joint swelling, arthralgias and gait problem.  Skin: Negative for color change, rash and wound.  Neurological: Negative for dizziness, tremors, seizures, syncope, facial asymmetry, speech difficulty, weakness, light-headedness, numbness and headaches.  Hematological: Negative for adenopathy. Does not bruise/bleed easily.  Psychiatric/Behavioral: Negative for  suicidal ideas, hallucinations, behavioral problems, confusion, disturbed wake/sleep cycle, self-injury, dysphoric mood, decreased concentration and agitation. The patient is not nervous/anxious.        Objective:   Physical Exam  Constitutional: He appears well-developed and well-nourished.  HENT:  Head: Normocephalic and atraumatic.  Right Ear: External ear normal.  Left Ear: External ear normal.  Nose: Nose normal.  Mouth/Throat: Oropharynx is clear and moist.  Eyes: Conjunctivae and EOM are normal. Pupils are equal, round, and reactive to light. No scleral icterus.  Neck: Normal range of motion. Neck supple. No JVD present. No thyromegaly present.  Cardiovascular: Regular rhythm, normal heart sounds and intact distal pulses.  Exam reveals no gallop and no friction rub.   No murmur heard. Pulmonary/Chest: Effort normal and breath sounds normal. He exhibits no tenderness.  Abdominal: Soft. Bowel sounds are normal. He exhibits no distension and no mass. There is no tenderness.  Genitourinary: Prostate normal and penis normal.  Musculoskeletal: Normal range of motion. He exhibits no edema and no tenderness.  Lymphadenopathy:    He has no cervical adenopathy.  Neurological: He is alert. He has normal reflexes. No cranial nerve deficit. Coordination normal.  Skin: Skin is warm and dry. No rash noted.  Psychiatric: He has a normal mood and affect. His behavior is normal.          Assessment & Plan:   Preventive health examination Hypertension well controlled Dyslipidemia. Compliance with medications encouraged recheck 6 months

## 2011-12-24 NOTE — Patient Instructions (Signed)
Limit your sodium (Salt) intake    It is important that you exercise regularly, at least 20 minutes 3 to 4 times per week.  If you develop chest pain or shortness of breath seek  medical attention.  You need to lose weight.  Consider a lower calorie diet and regular exercise.  Return in 6 months for follow-up   

## 2011-12-25 NOTE — Progress Notes (Signed)
Quick Note:  Spoke with pt- informed labs normal ______ 

## 2012-01-03 ENCOUNTER — Telehealth: Payer: Self-pay

## 2012-01-03 NOTE — Telephone Encounter (Signed)
done

## 2012-01-14 ENCOUNTER — Ambulatory Visit (INDEPENDENT_AMBULATORY_CARE_PROVIDER_SITE_OTHER): Payer: BC Managed Care – PPO | Admitting: Internal Medicine

## 2012-01-14 DIAGNOSIS — Z2911 Encounter for prophylactic immunotherapy for respiratory syncytial virus (RSV): Secondary | ICD-10-CM

## 2012-01-14 DIAGNOSIS — Z0000 Encounter for general adult medical examination without abnormal findings: Secondary | ICD-10-CM

## 2012-02-05 ENCOUNTER — Other Ambulatory Visit: Payer: Self-pay

## 2012-02-05 MED ORDER — SERTRALINE HCL 50 MG PO TABS: 50.0000 mg | ORAL_TABLET | Freq: Every day | ORAL | Status: DC

## 2012-02-12 ENCOUNTER — Other Ambulatory Visit: Payer: Self-pay

## 2012-02-12 MED ORDER — NIACIN ER (ANTIHYPERLIPIDEMIC) 1000 MG PO TBCR: 1000.0000 mg | EXTENDED_RELEASE_TABLET | Freq: Every day | ORAL | Status: DC

## 2012-03-17 ENCOUNTER — Other Ambulatory Visit: Payer: Self-pay | Admitting: Internal Medicine

## 2012-03-17 NOTE — Telephone Encounter (Signed)
Med filled.  

## 2012-04-14 ENCOUNTER — Other Ambulatory Visit: Payer: Self-pay | Admitting: Internal Medicine

## 2012-04-28 ENCOUNTER — Other Ambulatory Visit: Payer: Self-pay | Admitting: Internal Medicine

## 2012-07-11 ENCOUNTER — Other Ambulatory Visit: Payer: Self-pay | Admitting: Internal Medicine

## 2012-07-13 ENCOUNTER — Other Ambulatory Visit: Payer: Self-pay | Admitting: Internal Medicine

## 2012-07-14 ENCOUNTER — Other Ambulatory Visit: Payer: Self-pay | Admitting: Internal Medicine

## 2012-07-23 ENCOUNTER — Other Ambulatory Visit: Payer: Self-pay | Admitting: Internal Medicine

## 2012-10-05 ENCOUNTER — Encounter (HOSPITAL_COMMUNITY): Payer: Self-pay | Admitting: Emergency Medicine

## 2012-10-05 ENCOUNTER — Emergency Department (HOSPITAL_COMMUNITY): Payer: BC Managed Care – PPO

## 2012-10-05 ENCOUNTER — Emergency Department (HOSPITAL_COMMUNITY)
Admission: EM | Admit: 2012-10-05 | Discharge: 2012-10-05 | Disposition: A | Discharge status: Home / Self Care | Payer: BC Managed Care – PPO | Attending: Emergency Medicine | Admitting: Emergency Medicine

## 2012-10-05 DIAGNOSIS — W268XXA Contact with other sharp object(s), not elsewhere classified, initial encounter: Secondary | ICD-10-CM | POA: Insufficient documentation

## 2012-10-05 DIAGNOSIS — Y9289 Other specified places as the place of occurrence of the external cause: Secondary | ICD-10-CM | POA: Insufficient documentation

## 2012-10-05 DIAGNOSIS — K219 Gastro-esophageal reflux disease without esophagitis: Secondary | ICD-10-CM | POA: Insufficient documentation

## 2012-10-05 DIAGNOSIS — S51811A Laceration without foreign body of right forearm, initial encounter: Secondary | ICD-10-CM

## 2012-10-05 DIAGNOSIS — Y9389 Activity, other specified: Secondary | ICD-10-CM | POA: Insufficient documentation

## 2012-10-05 DIAGNOSIS — Y990 Civilian activity done for income or pay: Secondary | ICD-10-CM | POA: Insufficient documentation

## 2012-10-05 DIAGNOSIS — E785 Hyperlipidemia, unspecified: Secondary | ICD-10-CM | POA: Insufficient documentation

## 2012-10-05 DIAGNOSIS — Z23 Encounter for immunization: Secondary | ICD-10-CM | POA: Insufficient documentation

## 2012-10-05 DIAGNOSIS — Z880 Allergy status to penicillin: Secondary | ICD-10-CM | POA: Insufficient documentation

## 2012-10-05 DIAGNOSIS — Z79899 Other long term (current) drug therapy: Secondary | ICD-10-CM | POA: Insufficient documentation

## 2012-10-05 DIAGNOSIS — S51809A Unspecified open wound of unspecified forearm, initial encounter: Secondary | ICD-10-CM | POA: Insufficient documentation

## 2012-10-05 DIAGNOSIS — I10 Essential (primary) hypertension: Secondary | ICD-10-CM | POA: Insufficient documentation

## 2012-10-05 DIAGNOSIS — F430 Acute stress reaction: Secondary | ICD-10-CM | POA: Insufficient documentation

## 2012-10-05 IMAGING — CR DG FOREARM 2V*R*
2 series · 2 of 2 positions shown · non-contrast
Comparison: None.

CLINICAL DATA: Laceration to the right posterior forearm.  The
patient was struck in the arm by airplane propeller.

RIGHT FOREARM - 2 VIEW

[x forearm ap right]
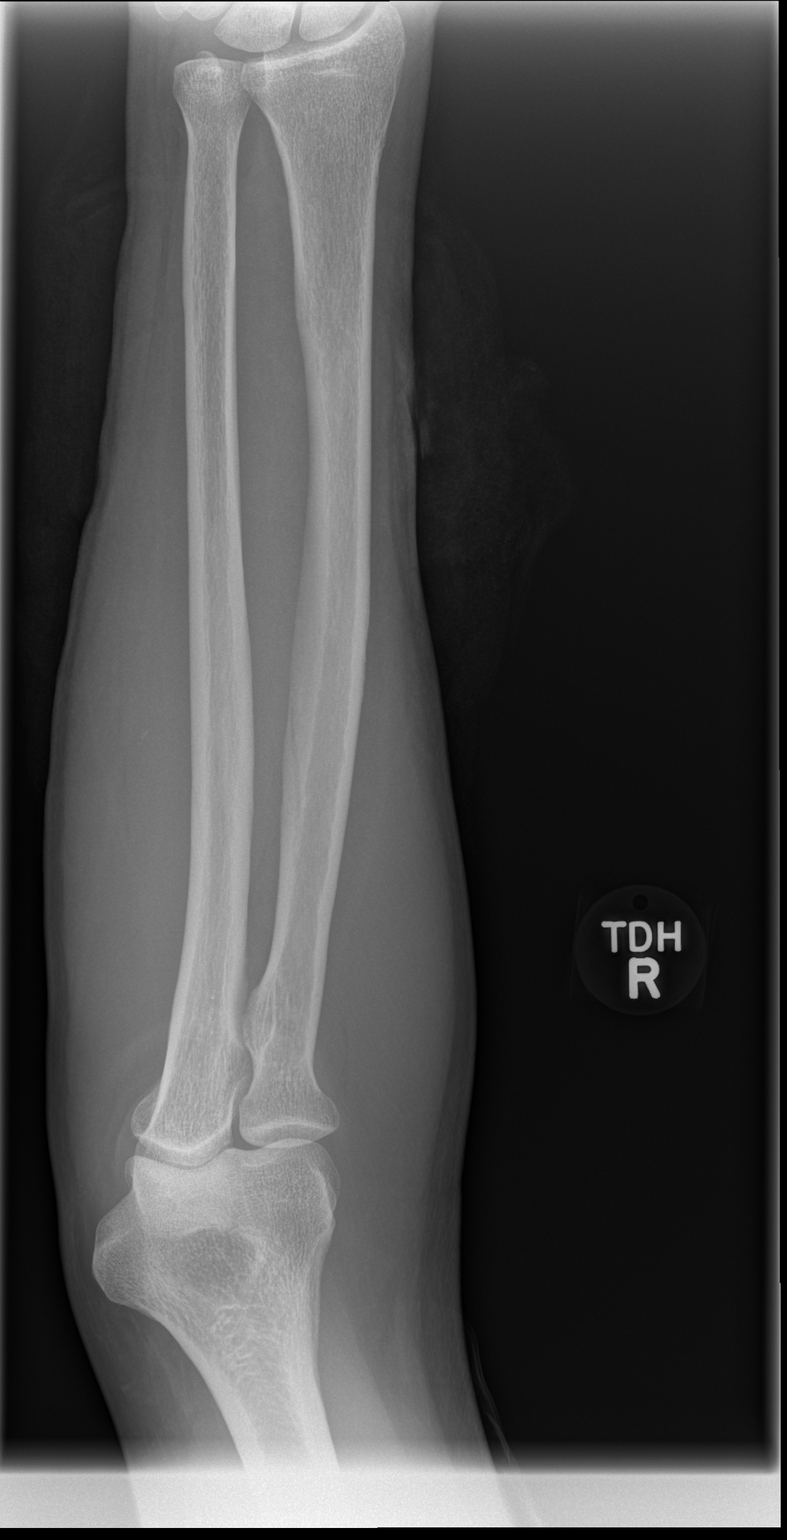

[x forearm lat right]
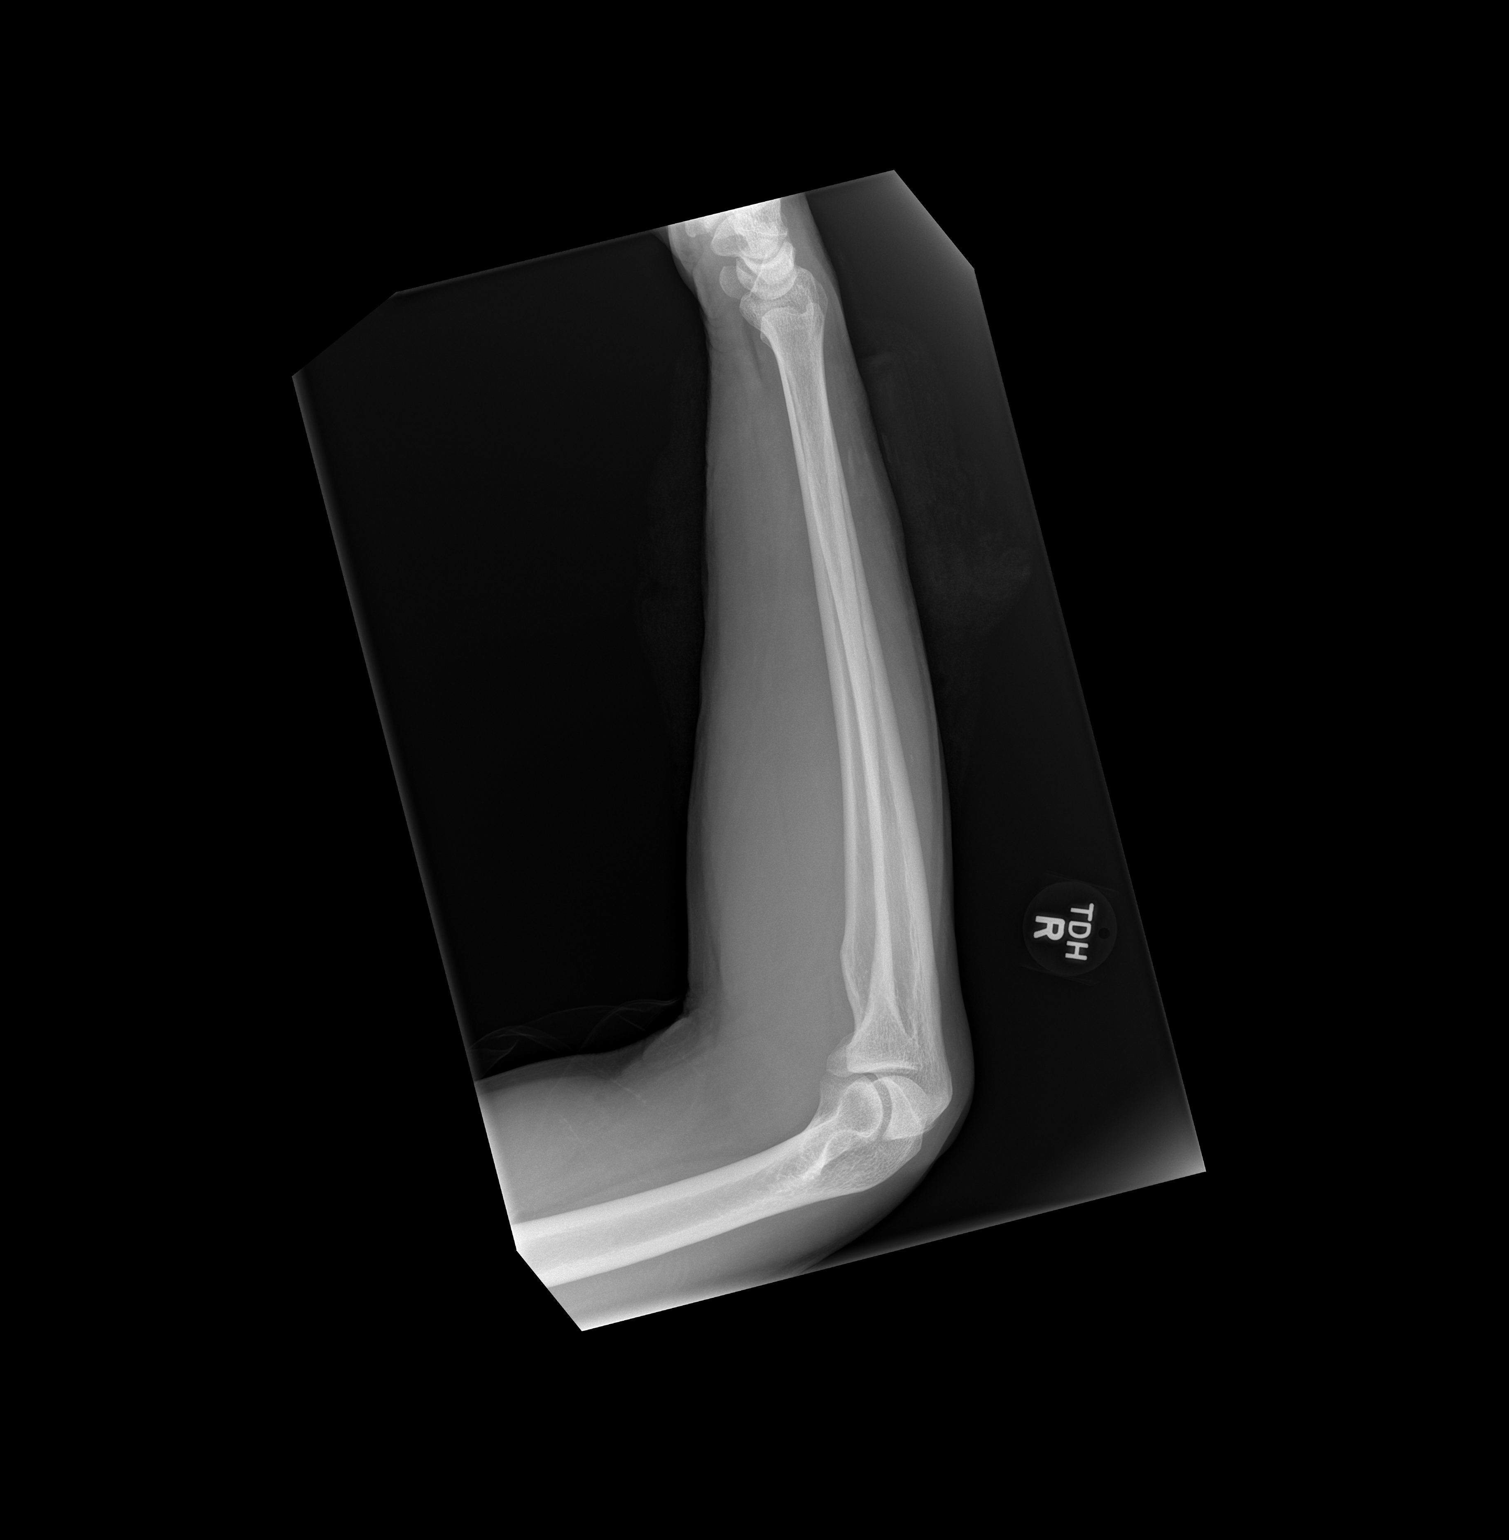

[2 of 2 positions shown; findings below may reference images not displayed]

FINDINGS: Soft tissue and skin defect along the mid/distal radial
aspect of the right forearm consistent with history of soft tissue
trauma.  Increased density and swelling suggest hematoma.  No
subcutaneous air or foreign body is visualized.  Underlying bones
appear intact.  No evidence of acute fracture or subluxation.  No
focal bone lesion or bone destruction.
IMPRESSION: Soft tissue injury to the distal right forearm.  No underlying bony
injury appreciated.

## 2012-10-05 MED ORDER — OXYCODONE-ACETAMINOPHEN 5-325 MG PO TABS
2.0000 | ORAL_TABLET | Freq: Once | ORAL | Status: AC
  Administered 2012-10-05: 2 via ORAL
  Filled 2012-10-05: qty 2

## 2012-10-05 MED ORDER — TETANUS-DIPHTH-ACELL PERTUSSIS 5-2.5-18.5 LF-MCG/0.5 IM SUSP
0.5000 mL | Freq: Once | INTRAMUSCULAR | Status: AC
  Administered 2012-10-05: 0.5 mL via INTRAMUSCULAR
  Filled 2012-10-05: qty 0.5

## 2012-10-05 MED ORDER — SULFAMETHOXAZOLE-TRIMETHOPRIM 800-160 MG PO TABS: 1.0000 | ORAL_TABLET | Freq: Two times a day (BID) | ORAL | Status: DC

## 2012-10-05 MED ORDER — HYDROCODONE-ACETAMINOPHEN 5-325 MG PO TABS: 2.0000 | ORAL_TABLET | Freq: Four times a day (QID) | ORAL | Status: DC | PRN

## 2012-10-05 NOTE — ED Notes (Signed)
P.A. At bedside for suturing

## 2012-10-05 NOTE — ED Notes (Signed)
C/o approx 2-3 inch laceration to R forearm and approx 1-2 cm laceration to R 2nd digit x approx 45 min.  Pt states he cut them while working on a model airplane.  Last DT > 10 yrs

## 2012-10-05 NOTE — ED Notes (Signed)
Pt denies any questions upon discharge. 

## 2012-10-05 NOTE — ED Provider Notes (Signed)
History    This chart was scribed for non-physician practitioner working with Celene Kras, MD by Leone Payor, ED Scribe. This patient was seen in room TR10C/TR10C and the patient's care was started at 1946.   CSN: [id number]  Arrival date & time 10/05/12  1946   First MD Initiated Contact with Patient 10/05/12 2010      Chief Complaint  Patient presents with  . Extremity Laceration     The history is provided by the patient. No language interpreter was used.    HPI Comments: Steven [name] is a 62 y.o. male who presents to the Emergency Department complaining of laceration to his R forearm and R 2nd digit that occurred earlier tonight. Pt states he was cut while working on a model airplane. Bleeding is controlled.  His pain is moderate. He has not tried anything to alleviate his symptoms. His last tetanus was greater than 10 years.   Past Medical History  Diagnosis Date  . Hyperlipidemia   . Hypertension   . Situational stress   . GERD with stricture     Past Surgical History  Procedure Laterality Date  . Cardiolite stresstest  fall 2010  . Colonoscopy  fall 2010  . Upper gastrointestinal endoscopy  fall 2010    Family History  Problem Relation Age of Onset  . Cancer Mother     lung   . Lymphoma Father   . Coronary artery disease Sister     History  Substance Use Topics  . Smoking status: Never Smoker   . Smokeless tobacco: Never Used  . Alcohol Use: Yes      Review of Systems A complete 10 system review of systems was obtained and all systems are negative except as noted in the HPI and PMH.   Allergies  Penicillins  Home Medications   Current Outpatient Rx  Name  Route  Sig  Dispense  Refill  . lisinopril (PRINIVIL,ZESTRIL) 20 MG tablet      TAKE 1 TABLET BY MOUTH DAILY   90 tablet   1   . niacin (NIASPAN) 1000 MG CR tablet      TAKE 1 TABLET AT BEDTIME   90 tablet   1   . omeprazole (PRILOSEC) 40 MG capsule   Oral   Take 1 capsule (40 mg  total) by mouth daily.   90 capsule   5   . sertraline (ZOLOFT) 50 MG tablet      TAKE 1 TABLET BY MOUTH DAILY   90 tablet   1     FAXED-MAIL-[facility]   . simvastatin (ZOCOR) 40 MG tablet   Oral   Take 1 tablet (40 mg total) by mouth every evening.   90 tablet   2   . triazolam (HALCION) 0.25 MG tablet   Oral   Take 0.25 mg by mouth at bedtime as needed.           BP 129/87  Pulse 63  Temp(Src) 96.9 F (36.1 C) (Oral)  Resp 18  SpO2 98%  Physical Exam  Nursing note and vitals reviewed. Constitutional: He is oriented to person, place, and time. He appears well-developed and well-nourished. No distress.  HENT:  Head: Normocephalic and atraumatic.  Eyes: EOM are normal.  Neck: Neck supple. No tracheal deviation present.  Cardiovascular: Normal rate.   Pulmonary/Chest: Effort normal. No respiratory distress.  Musculoskeletal: Normal range of motion.  R arm tender to palpation near the laceration site. Wrist and hand and finger  range of motion and strength 5/5, no tendon involvement  Neurological: He is alert and oriented to person, place, and time.  Skin: Skin is warm and dry.  5 cm laceration to right forearm, extending into the subcutaneous fat and fascia, no tendon involvement,  Psychiatric: He has a normal mood and affect. His behavior is normal.    ED Course  Procedures (including critical care time)  DIAGNOSTIC STUDIES: Oxygen Saturation is 98% on room air, normal by my interpretation.    COORDINATION OF CARE: 8:39 PM Discussed treatment plan with pt at bedside and pt agreed to plan.   Labs Reviewed - No data to display Dg Forearm Right  10/05/2012   *RADIOLOGY REPORT*  Clinical Data: Laceration to the right posterior forearm.  The patient was struck in the arm by airplane propeller.  RIGHT FOREARM - 2 VIEW  Comparison: None.  Findings: Soft tissue and skin defect along the mid/distal radial aspect of the right forearm consistent with history of soft  tissue trauma.  Increased density and swelling suggest hematoma.  No subcutaneous air or foreign body is visualized.  Underlying bones appear intact.  No evidence of acute fracture or subluxation.  No focal bone lesion or bone destruction.  IMPRESSION: Soft tissue injury to the distal right forearm.  No underlying bony injury appreciated.   Original Report Authenticated By: Burman Nieves, M.D.   LACERATION REPAIR Performed by: Roxy Horseman Authorized by: Roxy Horseman Consent: Verbal consent obtained. Risks and benefits: risks, benefits and alternatives were discussed Consent given by: patient Patient identity confirmed: provided demographic data Prepped and Draped in normal sterile fashion Wound explored  Laceration Location: right forearm  Laceration Length: 7cm  No Foreign Bodies seen or palpated  Anesthesia: local infiltration  Local anesthetic: lidocaine 2% with epinephrine  Anesthetic total: 10 ml  Irrigation method: syringe Amount of cleaning: standard  Skin closure: 4-0 prolene  Number of sutures: 15  Technique: simple running  Patient tolerance: Patient tolerated the procedure well with no immediate complications.  LACERATION REPAIR Performed by: Roxy Horseman Authorized by: Roxy Horseman Consent: Verbal consent obtained. Risks and benefits: risks, benefits and alternatives were discussed Consent given by: patient Patient identity confirmed: provided demographic data Prepped and Draped in normal sterile fashion Wound explored  Laceration Location: right forearm subcutaneous  Laceration Length: 3cm  No Foreign Bodies seen or palpated  Anesthesia: local infiltration  Local anesthetic: lidocaine 2% with epinephrine  Anesthetic total: 3 ml  Irrigation method: syringe Amount of cleaning: standard  Skin closure: 4-0 vicryl antimicrobial  Number of sutures: 6  Technique: subcutaneous  Patient tolerance: Patient tolerated the procedure  well with no immediate complications. \    1. Forearm laceration, right, initial encounter       MDM  Patient was laceration. Will x-ray, and then repaired.  During exploration and irrigation, it was determined that the laceration penetrated the subcutaneous fat and cut into the underlying fascia. Dr. Lynelle Doctor evaluated the patient, and no tendon damage was evident. I will close the laceration with absorbable subcutaneous sutures through the fascia, and then will close the overlying skin.  Patient has small bleed from a small arteriole.  Bleeding was controlled prior to irrigation, but has resumed bleeding with irrigation. I discussed this with Dr. Lynelle Doctor, who tells me that the bleeding will be controlled when I finished suturing.  The bleeding appears to have stopped following sutures. The wound was cleaned, and bandaged appropriately. Then ice applied to aide with bruising.  Tetanus  is updated. Return precautions have been given. The patient a course of Bactrim. He is allergic to penicillins. He is stable and ready for discharge. Return to primary care in 10 days for suture removal.  I personally performed the services described in this documentation, which was scribed in my presence. The recorded information has been reviewed and is accurate.      Roxy Horseman, PA-C 10/05/12 2343  Roxy Horseman, PA-C 10/07/12 2022

## 2012-10-08 NOTE — ED Provider Notes (Signed)
Medical screening examination/treatment/procedure(s) were performed by non-physician practitioner and as supervising physician I was immediately available for consultation/collaboration.  Pt's wound was explored through range of motion. Laceration did involve muscle body.  No sign of tendon or nerve injury.  Normal grip srength, flexion and extension of wrist.  Distal nv intact.    Celene Kras, MD 10/08/12 [phone]

## 2012-10-15 ENCOUNTER — Ambulatory Visit: Payer: BC Managed Care – PPO | Admitting: Internal Medicine

## 2012-10-16 ENCOUNTER — Encounter: Payer: Self-pay | Admitting: Family Medicine

## 2012-10-16 ENCOUNTER — Ambulatory Visit (INDEPENDENT_AMBULATORY_CARE_PROVIDER_SITE_OTHER): Payer: BC Managed Care – PPO | Admitting: Family Medicine

## 2012-10-16 VITALS — BP 120/82 | Temp 98.2°F | Wt 203.0 lb

## 2012-10-16 DIAGNOSIS — S51819A Laceration without foreign body of unspecified forearm, initial encounter: Secondary | ICD-10-CM

## 2012-10-16 DIAGNOSIS — S51809A Unspecified open wound of unspecified forearm, initial encounter: Secondary | ICD-10-CM

## 2012-10-16 NOTE — Progress Notes (Signed)
Chief Complaint  Patient presents with  . Suture / Staple Removal    HPI:  ED follow up for staple removal: -suffered lac of R forearm 6/8 -repaired in ED with 15 sutures per notes  ROS: See pertinent positives and negatives per HPI.  Past Medical History  Diagnosis Date  . Hyperlipidemia   . Hypertension   . Situational stress   . GERD with stricture     Family History  Problem Relation Age of Onset  . Cancer Mother     lung   . Lymphoma Father   . Coronary artery disease Sister     History   Social History  . Marital Status: Single    Spouse Name: N/A    Number of Children: N/A  . Years of Education: N/A   Social History Main Topics  . Smoking status: Never Smoker   . Smokeless tobacco: Never Used  . Alcohol Use: Yes  . Drug Use: No  . Sexually Active: None   Other Topics Concern  . None   Social History Narrative  . None    Current outpatient prescriptions:HYDROcodone-acetaminophen (NORCO/VICODIN) 5-325 MG per tablet, Take 2 tablets by mouth every 6 (six) hours as needed for pain., Disp: 13 tablet, Rfl: 0;  lisinopril (PRINIVIL,ZESTRIL) 20 MG tablet, TAKE 1 TABLET BY MOUTH DAILY, Disp: 90 tablet, Rfl: 1;  niacin (NIASPAN) 1000 MG CR tablet, TAKE 1 TABLET AT BEDTIME, Disp: 90 tablet, Rfl: 1 omeprazole (PRILOSEC) 40 MG capsule, Take 1 capsule (40 mg total) by mouth daily., Disp: 90 capsule, Rfl: 5;  sertraline (ZOLOFT) 50 MG tablet, TAKE 1 TABLET BY MOUTH DAILY, Disp: 90 tablet, Rfl: 1;  simvastatin (ZOCOR) 40 MG tablet, Take 1 tablet (40 mg total) by mouth every evening., Disp: 90 tablet, Rfl: 2;  sulfamethoxazole-trimethoprim (SEPTRA DS) 800-160 MG per tablet, Take 1 tablet by mouth every 12 (twelve) hours., Disp: 20 tablet, Rfl: 0 triazolam (HALCION) 0.25 MG tablet, Take 0.25 mg by mouth at bedtime as needed., Disp: , Rfl:   EXAM:  Filed Vitals:   10/16/12 0956  BP: 120/82  Temp: 98.2 F (36.8 C)    Body mass index is 28.71 kg/(m^2).  GENERAL:  vitals reviewed and listed above, alert, oriented, appears well hydrated and in no acute distress  HEENT: atraumatic, conjunttiva clear, no obvious abnormalities on inspection of external nose and ears  NECK: no obvious masses on inspection  SKIN: Healing lac R forearm, no signs of infection  MS: moves all extremities without noticeable abnormality  PSYCH: pleasant and cooperative, no obvious depression or anxiety  ASSESSMENT AND PLAN:  Discussed the following assessment and plan:  Laceration of forearm, unspecified laterality, initial encounter  -sutures removed, appears to be healing well, placed several steristrips to aid healing given area of high tension, wound recs and follow up discusse -Patient advised to return or notify a doctor immediately if symptoms worsen or persist or new concerns arise.  There are no Patient Instructions on file for this visit.   Kriste Basque R.

## 2012-11-16 ENCOUNTER — Other Ambulatory Visit: Payer: Self-pay | Admitting: Internal Medicine

## 2013-01-12 ENCOUNTER — Other Ambulatory Visit: Payer: Self-pay | Admitting: Internal Medicine

## 2013-02-06 ENCOUNTER — Other Ambulatory Visit: Payer: Self-pay

## 2013-02-06 MED ORDER — NIACIN ER (ANTIHYPERLIPIDEMIC) 1000 MG PO TBCR: EXTENDED_RELEASE_TABLET | ORAL | Status: DC

## 2013-02-06 NOTE — Telephone Encounter (Signed)
Rx for Niacin sent to pharmacy.

## 2013-02-10 ENCOUNTER — Other Ambulatory Visit: Payer: Self-pay | Admitting: Internal Medicine

## 2013-04-23 ENCOUNTER — Other Ambulatory Visit: Payer: Self-pay | Admitting: Internal Medicine

## 2013-05-11 ENCOUNTER — Other Ambulatory Visit: Payer: Self-pay | Admitting: Internal Medicine

## 2013-05-27 ENCOUNTER — Other Ambulatory Visit: Payer: Self-pay | Admitting: Internal Medicine

## 2013-06-03 ENCOUNTER — Encounter: Payer: Self-pay | Admitting: *Deleted

## 2013-06-04 ENCOUNTER — Other Ambulatory Visit (INDEPENDENT_AMBULATORY_CARE_PROVIDER_SITE_OTHER): Payer: BC Managed Care – PPO

## 2013-06-04 DIAGNOSIS — Z0000 Encounter for general adult medical examination without abnormal findings: Secondary | ICD-10-CM

## 2013-06-04 LAB — CBC WITH DIFFERENTIAL/PLATELET
BASOS ABS: 0 10*3/uL (ref 0.0–0.1)
Basophils Relative: 0.4 % (ref 0.0–3.0)
Eosinophils Absolute: 0.1 10*3/uL (ref 0.0–0.7)
Eosinophils Relative: 0.9 % (ref 0.0–5.0)
HCT: 45.9 % (ref 39.0–52.0)
Hemoglobin: 15.4 g/dL (ref 13.0–17.0)
LYMPHS PCT: 31.1 % (ref 12.0–46.0)
Lymphs Abs: 1.7 10*3/uL (ref 0.7–4.0)
MCHC: 33.7 g/dL (ref 30.0–36.0)
MCV: 97.2 fl (ref 78.0–100.0)
MONOS PCT: 10.9 % (ref 3.0–12.0)
Monocytes Absolute: 0.6 10*3/uL (ref 0.1–1.0)
Neutro Abs: 3.1 10*3/uL (ref 1.4–7.7)
Neutrophils Relative %: 56.7 % (ref 43.0–77.0)
PLATELETS: 217 10*3/uL (ref 150.0–400.0)
RBC: 4.73 Mil/uL (ref 4.22–5.81)
RDW: 12.8 % (ref 11.5–14.6)
WBC: 5.5 10*3/uL (ref 4.5–10.5)

## 2013-06-04 LAB — POCT URINALYSIS DIPSTICK
BILIRUBIN UA: NEGATIVE
Blood, UA: NEGATIVE
Glucose, UA: NEGATIVE
KETONES UA: NEGATIVE
LEUKOCYTES UA: NEGATIVE
NITRITE UA: NEGATIVE
Spec Grav, UA: 1.015
Urobilinogen, UA: 0.2
pH, UA: 8.5

## 2013-06-04 LAB — LDL CHOLESTEROL, DIRECT: Direct LDL: 145.8 mg/dL

## 2013-06-04 LAB — LIPID PANEL
CHOLESTEROL: 237 mg/dL — AB (ref 0–200)
HDL: 48.9 mg/dL (ref >39.00)
Total CHOL/HDL Ratio: 5
Triglycerides: 290 mg/dL — ABNORMAL HIGH (ref 0.0–149.0)
VLDL: 58 mg/dL — AB (ref 0.0–40.0)

## 2013-06-04 LAB — BASIC METABOLIC PANEL
BUN: 14 mg/dL (ref 6–23)
CALCIUM: 9.9 mg/dL (ref 8.4–10.5)
CO2: 29 mEq/L (ref 19–32)
Chloride: 103 mEq/L (ref 96–112)
Creatinine, Ser: 0.9 mg/dL (ref 0.4–1.5)
GFR: 89.72 mL/min (ref >60.00)
GLUCOSE: 111 mg/dL — AB (ref 70–99)
Potassium: 5.1 mEq/L (ref 3.5–5.1)
Sodium: 139 mEq/L (ref 135–145)

## 2013-06-04 LAB — HEPATIC FUNCTION PANEL
ALK PHOS: 74 U/L (ref 39–117)
ALT: 29 U/L (ref 0–53)
AST: 24 U/L (ref 0–37)
Albumin: 4.5 g/dL (ref 3.5–5.2)
BILIRUBIN TOTAL: 1.1 mg/dL (ref 0.3–1.2)
Bilirubin, Direct: 0.1 mg/dL (ref 0.0–0.3)
Total Protein: 7.1 g/dL (ref 6.0–8.3)

## 2013-06-04 LAB — TESTOSTERONE: Testosterone: 237.18 ng/dL — ABNORMAL LOW (ref 350.00–890.00)

## 2013-06-04 LAB — PSA: PSA: 1.31 ng/mL (ref 0.10–4.00)

## 2013-06-04 LAB — TSH: TSH: 1.45 u[IU]/mL (ref 0.35–5.50)

## 2013-06-11 ENCOUNTER — Ambulatory Visit (INDEPENDENT_AMBULATORY_CARE_PROVIDER_SITE_OTHER): Payer: BC Managed Care – PPO | Admitting: Internal Medicine

## 2013-06-11 ENCOUNTER — Encounter: Payer: Self-pay | Admitting: Internal Medicine

## 2013-06-11 VITALS — BP 110/80 | HR 88 | Temp 98.2°F | Resp 20 | Ht 70.0 in | Wt 213.0 lb

## 2013-06-11 DIAGNOSIS — E349 Endocrine disorder, unspecified: Secondary | ICD-10-CM | POA: Insufficient documentation

## 2013-06-11 DIAGNOSIS — E785 Hyperlipidemia, unspecified: Secondary | ICD-10-CM

## 2013-06-11 DIAGNOSIS — E291 Testicular hypofunction: Secondary | ICD-10-CM | POA: Insufficient documentation

## 2013-06-11 DIAGNOSIS — R7309 Other abnormal glucose: Secondary | ICD-10-CM

## 2013-06-11 DIAGNOSIS — R7302 Impaired glucose tolerance (oral): Secondary | ICD-10-CM

## 2013-06-11 DIAGNOSIS — I10 Essential (primary) hypertension: Secondary | ICD-10-CM

## 2013-06-11 MED ORDER — LISINOPRIL 20 MG PO TABS: ORAL_TABLET | ORAL | Status: DC

## 2013-06-11 MED ORDER — OMEPRAZOLE 40 MG PO CPDR: DELAYED_RELEASE_CAPSULE | ORAL | Status: DC

## 2013-06-11 MED ORDER — ATORVASTATIN CALCIUM 40 MG PO TABS: 40.0000 mg | ORAL_TABLET | Freq: Every day | ORAL | Status: DC

## 2013-06-11 MED ORDER — SERTRALINE HCL 50 MG PO TABS: ORAL_TABLET | ORAL | Status: DC

## 2013-06-11 NOTE — Patient Instructions (Signed)
Limit your sodium (Salt) intake    It is important that you exercise regularly, at least 20 minutes 3 to 4 times per week.  If you develop chest pain or shortness of breath seek  medical attention.  You need to lose weight.  Consider a lower calorie diet and regular exercise.  Please check your blood pressure on a regular basis.  If it is consistently greater than 150/90, please make an office appointment.  Return in one year for follow-up  Schedule your colonoscopy to help detect colon cancer. 

## 2013-06-11 NOTE — Progress Notes (Signed)
Pre-visit discussion using our clinic review tool. No additional management support is needed unless otherwise documented below in the visit note.  

## 2013-06-11 NOTE — Progress Notes (Signed)
Subjective:    Patient ID: Steven [name], male    DOB: March 12, 1951, 63 y.o.   MRN: [id number]  HPI  63 year-old patient who is seen today for a health maintenance exam. He has a history of hypertension dyslipidemia and mild impaired glucose tolerance. He has gastro-esophageal reflux disease. He is doing quite well today. No concerns or complaints. Patient has been on sertraline for an anxiety disorder which has been quite helpful. His last colonoscopy was approximately 10 years ago.   Past Medical History  Diagnosis Date  . Hyperlipidemia   . Hypertension   . Situational stress   . GERD with stricture     History   Social History  . Marital Status: Single    Spouse Name: N/A    Number of Children: N/A  . Years of Education: N/A   Occupational History  . Not on file.   Social History Main Topics  . Smoking status: Never Smoker   . Smokeless tobacco: Never Used  . Alcohol Use: Yes  . Drug Use: No  . Sexual Activity: Not on file   Other Topics Concern  . Not on file   Social History Narrative  . No narrative on file    Past Surgical History  Procedure Laterality Date  . Cardiolite stresstest  fall 2010  . Colonoscopy  fall 2010  . Upper gastrointestinal endoscopy  fall 2010    Family History  Problem Relation Age of Onset  . Cancer Mother     lung   . Lymphoma Father   . Coronary artery disease Sister     Allergies  Allergen Reactions  . Penicillins     unknown    Current Outpatient Prescriptions on File Prior to Visit  Medication Sig Dispense Refill  . niacin (NIASPAN) 1000 MG CR tablet TAKE 1 TABLET AT BEDTIME  90 tablet  1  . triazolam (HALCION) 0.25 MG tablet TAKE 1 TABLET BY MOUTH AT BEDTIME AS NEEDED  30 tablet  1   No current facility-administered medications on file prior to visit.    BP 110/80  Pulse 88  Temp(Src) 98.2 F (36.8 C) (Oral)  Resp 20  Ht 5\' 10"  (1.778 m)  Wt 213 lb (96.616 kg)  BMI 30.56 kg/m2  SpO2 96%   Past  Medical History:   Hyperlipidemia  Hypertension  situational stress  GERD/stricture  Testosterone deficiency  Past Surgical History:   Cardiolite stress test, fall 2010  colonoscopy upper endoscopy, fall 2010     Review of Systems  Constitutional: Negative for fever, chills, activity change, appetite change and fatigue.  HENT: Negative for congestion, dental problem, ear pain, hearing loss, mouth sores, rhinorrhea, sinus pressure, sneezing, tinnitus, trouble swallowing and voice change.   Eyes: Negative for photophobia, pain, redness and visual disturbance.  Respiratory: Negative for apnea, cough, choking, chest tightness, shortness of breath and wheezing.   Cardiovascular: Negative for chest pain, palpitations and leg swelling.  Gastrointestinal: Negative for nausea, vomiting, abdominal pain, diarrhea, constipation, blood in stool, abdominal distention, anal bleeding and rectal pain.  Genitourinary: Negative for dysuria, urgency, frequency, hematuria, flank pain, decreased urine volume, discharge, penile swelling, scrotal swelling, difficulty urinating, genital sores and testicular pain.  Musculoskeletal: Negative for arthralgias, back pain, gait problem, joint swelling, myalgias, neck pain and neck stiffness.  Skin: Negative for color change, rash and wound.  Neurological: Negative for dizziness, tremors, seizures, syncope, facial asymmetry, speech difficulty, weakness, light-headedness, numbness and headaches.  Hematological: Negative for adenopathy. Does  not bruise/bleed easily.  Psychiatric/Behavioral: Negative for suicidal ideas, hallucinations, behavioral problems, confusion, sleep disturbance, self-injury, dysphoric mood, decreased concentration and agitation. The patient is not nervous/anxious.        Objective:   Physical Exam  Constitutional: He appears well-developed and well-nourished.  HENT:  Head: Normocephalic and atraumatic.  Right Ear: External ear normal.  Left  Ear: External ear normal.  Nose: Nose normal.  Mouth/Throat: Oropharynx is clear and moist.  Eyes: Conjunctivae and EOM are normal. Pupils are equal, round, and reactive to light. No scleral icterus.  Neck: Normal range of motion. Neck supple. No JVD present. No thyromegaly present.  Cardiovascular: Regular rhythm, normal heart sounds and intact distal pulses.  Exam reveals no gallop and no friction rub.   No murmur heard. Pulmonary/Chest: Effort normal and breath sounds normal. He exhibits no tenderness.  Abdominal: Soft. Bowel sounds are normal. He exhibits no distension and no mass. There is no tenderness.  Genitourinary: Prostate normal and penis normal.  Musculoskeletal: Normal range of motion. He exhibits no edema and no tenderness.  Lymphadenopathy:    He has no cervical adenopathy.  Neurological: He is alert. He has normal reflexes. No cranial nerve deficit. Coordination normal.  Skin: Skin is warm and dry. No rash noted.  Psychiatric: He has a normal mood and affect. His behavior is normal.          Assessment & Plan:   Preventive health examination Hypertension well controlled Dyslipidemia. Compliance with medications encouraged recheck 6 months Testosterone deficiency. The patient is asymptomatic and denies any ED or libido issues. He was given samples for transdermal replacement testosterone to try for 30 days.  Weight loss encouraged Regular exercise encouraged

## 2013-06-12 ENCOUNTER — Telehealth: Payer: Self-pay | Admitting: Internal Medicine

## 2013-06-12 NOTE — Telephone Encounter (Signed)
Relevant patient education assigned to patient using Emmi. ° °

## 2013-06-25 ENCOUNTER — Telehealth: Payer: Self-pay | Admitting: Internal Medicine

## 2013-06-25 NOTE — Telephone Encounter (Signed)
[facility] states pt would like a new script for motrin 800mg  #90.

## 2013-06-26 MED ORDER — IBUPROFEN 800 MG PO TABS: 800.0000 mg | ORAL_TABLET | Freq: Three times a day (TID) | ORAL | Status: DC | PRN

## 2013-06-26 NOTE — Telephone Encounter (Signed)
ok 

## 2013-06-26 NOTE — Telephone Encounter (Signed)
Okay to refill? 

## 2013-07-06 ENCOUNTER — Telehealth: Payer: Self-pay | Admitting: Internal Medicine

## 2013-07-06 NOTE — Telephone Encounter (Signed)
Pt has samples of axeron spray. Pt would like to get rx for this med. [facility] [address] Pt aware dr Kirtland Bouchardk out until 3/17.  Would like a CB if he can get.

## 2013-07-07 MED ORDER — TESTOSTERONE 30 MG/ACT TD SOLN: 2.0000 | Freq: Every day | TRANSDERMAL | Status: DC

## 2013-07-07 NOTE — Telephone Encounter (Signed)
Spoke to pt told him Rx for Axiron called into pharmacy. Pt verbalized understanding. Asked pt how it is working? Pt stated good, feels like he has more energy. Told pt okay, any problems call office. Pt verbalized understanding. Rx called into pharmacy.

## 2013-07-12 ENCOUNTER — Other Ambulatory Visit: Payer: Self-pay | Admitting: Internal Medicine

## 2013-07-13 ENCOUNTER — Telehealth: Payer: Self-pay | Admitting: Internal Medicine

## 2013-07-13 DIAGNOSIS — E349 Endocrine disorder, unspecified: Secondary | ICD-10-CM

## 2013-07-13 NOTE — Telephone Encounter (Signed)
[employer] denied Axiron PA, pt must try and fail Fortesta 60 gram pump per 30 days for a minimum of 90 days.  I have already submitted the PA required for [place] and it was approved 06/13/13- no expiration date and case # [id number].

## 2013-07-14 MED ORDER — TESTOSTERONE 10 MG/ACT (2%) TD GEL: TRANSDERMAL | Status: DC

## 2013-07-14 NOTE — Telephone Encounter (Signed)
Spoke to pt told him Axiron was denied by insurance had to change to [place] and was approved. New Rx called into pharmacy, apply one application every morning to inner thighs. Pt verbalized understanding. Told pt need to check testosterone level in 4-6 weeks per Dr.K . Pt verbalized understanding. Lab order put in EPIC.

## 2013-07-14 NOTE — Telephone Encounter (Signed)
Apply 60 gm to inner thighs every am Check level in 4-6 weeks

## 2013-07-14 NOTE — Telephone Encounter (Signed)
Dr. Kirtland BouchardK, please see message and advise dosage and directions for Fortesta if that is okay with you.

## 2013-07-20 ENCOUNTER — Other Ambulatory Visit: Payer: Self-pay | Admitting: Internal Medicine

## 2013-07-22 ENCOUNTER — Telehealth: Payer: Self-pay | Admitting: Internal Medicine

## 2013-07-22 NOTE — Telephone Encounter (Signed)
Patient Information: ° Caller Name: Little ° Phone: [phone] ° Patient: Steven [name], Steven [name] ° Gender: Male ° DOB: 05/25/1950 ° Age: 63 Years ° PCP: Kwiatkowski, Peter (Family Practice > 16yrs old) ° °Office Follow Up: ° Does the office need to follow up with this patient?: Yes ° Instructions For The Office: Please call and advise re medication. ° ° °Symptoms ° Reason For Call & Symptoms: Pt is calling to say he was prescribed Axeron spray (testosterone) which the insurance company denied. He had another ordered from the office which the insurance company also denied. Pt had to pay for it. He wants to know why he was instructed his dose should be 60gm? and its 10gm. RN checked EPIC and saw note in chart by MD indicates 60gm but order for med is Fortesta 10gm/1 application per day. Pt wants to know if he can "stop by" and pick up another sample of the Axeron spray and if office can talk to him about the dosing change and what to submit for next. ° Reviewed Health History In EMR: Yes ° Reviewed Medications In EMR: Yes ° Reviewed Allergies In EMR: Yes ° Reviewed Surgeries / Procedures: Yes ° Date of Onset of Symptoms: 07/21/2013 ° °Guideline(s) Used: ° No Protocol Available - Information Only ° °Disposition Per Guideline:  ° Discuss with PCP and Callback by Nurse Today ° °Reason For Disposition Reached:  ° Nursing judgment ° °Advice Given: ° N/A ° °Patient Will Follow Care Advice: ° YES ° ° °

## 2013-07-23 NOTE — Telephone Encounter (Signed)
Please advise correct dose, see message below.

## 2013-07-23 NOTE — Telephone Encounter (Signed)
No further samples of Axiron are available.  The daily dose for this product is 60 mg topically once daily Other topical agents.  Will have different dosing requirements Patient should check with insurance  to see what the preferred testosterone topical supplement is and we will fill that medication with proper dosing

## 2013-07-23 NOTE — Telephone Encounter (Signed)
Spoke to pt told him no Axiron samples are available and he is suppose to do 4 applications to inner thighs daily, not one. Pt verbalized understanding. Told pt will send new Rx to pharmacy to reflect increase in dose. Pt verbalized understanding. Asked pt how much he paid? Pt stated 20 dollars. Told him that is your co-pay with BC/BS due to [employer] will not pay at all. Pt verbalized understanding.

## 2013-07-24 MED ORDER — TESTOSTERONE 10 MG/ACT (2%) TD GEL: TRANSDERMAL | Status: DC

## 2013-07-24 NOTE — Telephone Encounter (Signed)
New Rx called in to pharmacy 

## 2013-07-27 ENCOUNTER — Telehealth: Payer: Self-pay | Admitting: Internal Medicine

## 2013-07-27 NOTE — Telephone Encounter (Signed)
Pt has [employer] and [address] [place], [employer] approved Fortesta but [employer] denied it.  Pt has already picked up the Fortesta from the pharmacy and paid $20.  Pt wants me to submit BCBS preferred medication Androgel or Androderm so he can have [employer]BS as primary and have [employer] as secondary.  I spoke to the pharmacy and was advised the new RX can be filled next month since he has already pick-up the [place].

## 2013-07-27 NOTE — Telephone Encounter (Signed)
Noted  

## 2013-07-27 NOTE — Telephone Encounter (Signed)
Pt verbalized understanding and said he will callback to let you know which RX he wants to try.  He is going to call [employer]

## 2013-07-27 NOTE — Telephone Encounter (Signed)
Please tell pt to check with his BC/BS how much co-pay will be because [employer] will not pay the difference. Also changing to Androgel or Androderm will still use multiple pumps due to different medication and dosages.

## 2013-07-29 ENCOUNTER — Telehealth: Payer: Self-pay | Admitting: Internal Medicine

## 2013-07-29 NOTE — Telephone Encounter (Signed)
Pt insurance will cover androgel of androderm at the dosage dr k reccommends pls let pt know when rx called in. [facility]

## 2013-07-30 NOTE — Telephone Encounter (Signed)
Left message on voicemail to call office.  

## 2013-07-30 NOTE — Telephone Encounter (Signed)
Androgel 1.62%  40.5 mg applied once daily in am to shoulders and upper arms Androderm patch   Apply one 4 mg patch every am

## 2013-07-30 NOTE — Telephone Encounter (Signed)
Dr. Kirtland BouchardK, please give order and directions for Androgel or Androderm for pt.

## 2013-08-04 NOTE — Telephone Encounter (Signed)
Spoke to pt asked him if he wants to try Androgel or Androderm patch. Pt said he will try the patch. Told pt okay will send Rx to pharmacy. Pt verbalized understanding

## 2013-08-05 MED ORDER — TESTOSTERONE 4 MG/24HR TD PT24: 1.0000 | MEDICATED_PATCH | Freq: Every day | TRANSDERMAL | Status: DC

## 2013-08-05 NOTE — Telephone Encounter (Signed)
[facility]

## 2013-08-09 ENCOUNTER — Other Ambulatory Visit: Payer: Self-pay | Admitting: Internal Medicine

## 2013-08-27 ENCOUNTER — Ambulatory Visit (INDEPENDENT_AMBULATORY_CARE_PROVIDER_SITE_OTHER): Payer: BC Managed Care – PPO | Admitting: Internal Medicine

## 2013-08-27 ENCOUNTER — Encounter: Payer: Self-pay | Admitting: Internal Medicine

## 2013-08-27 VITALS — BP 120/66 | HR 69 | Temp 98.4°F | Resp 20 | Ht 70.0 in | Wt 216.0 lb

## 2013-08-27 DIAGNOSIS — H8110 Benign paroxysmal vertigo, unspecified ear: Secondary | ICD-10-CM

## 2013-08-27 DIAGNOSIS — I10 Essential (primary) hypertension: Secondary | ICD-10-CM

## 2013-08-27 NOTE — Progress Notes (Signed)
Subjective:    Patient ID: Steven [name], male    DOB: 11/17/1950, 63 y.o.   MRN: [id number]  HPI 63 year old patient with a history of treated hypertension.  He presents with a two-week history of mild her to go associated with the head turning.  Symptoms resolve quite quickly and do not occur with daily activities.  No gait abnormality.  He does have a history of chronic right-sided tinnitus. He feels he does have some allergy-related symptoms with nasal stuffiness and right-sided rhinorrhea  Past Medical History  Diagnosis Date  . Hyperlipidemia   . Hypertension   . Situational stress   . GERD with stricture     History   Social History  . Marital Status: Single    Spouse Name: N/A    Number of Children: N/A  . Years of Education: N/A   Occupational History  . Not on file.   Social History Main Topics  . Smoking status: Never Smoker   . Smokeless tobacco: Never Used  . Alcohol Use: Yes  . Drug Use: No  . Sexual Activity: Not on file   Other Topics Concern  . Not on file   Social History Narrative  . No narrative on file    Past Surgical History  Procedure Laterality Date  . Cardiolite stresstest  fall 2010  . Colonoscopy  fall 2010  . Upper gastrointestinal endoscopy  fall 2010    Family History  Problem Relation Age of Onset  . Cancer Mother     lung   . Lymphoma Father   . Coronary artery disease Sister     Allergies  Allergen Reactions  . Penicillins     unknown    Current Outpatient Prescriptions on File Prior to Visit  Medication Sig Dispense Refill  . atorvastatin (LIPITOR) 40 MG tablet Take 1 tablet (40 mg total) by mouth daily.  90 tablet  3  . ibuprofen (ADVIL,MOTRIN) 800 MG tablet TAKE 1 TABLET BY MOUTH EVERY 8 HOURS DAILY AS NEEDED  30 tablet  1  . lisinopril (PRINIVIL,ZESTRIL) 20 MG tablet TAKE 1 TABLET BY MOUTH EVERY DAY  90 tablet  1  . Multiple Vitamin (MULTIVITAMIN) tablet Take 1 tablet by mouth daily.      . niacin (NIASPAN)  1000 MG CR tablet TAKE 1 TABLET BY MOUTH AT BEDTIME  90 tablet  3  . omeprazole (PRILOSEC) 40 MG capsule TAKE ONE CAPSULE BY MOUTH EVERY DAY  90 capsule  3  . sertraline (ZOLOFT) 50 MG tablet TAKE 1 TABLET BY MOUTH DAILY  90 tablet  3  . simvastatin (ZOCOR) 40 MG tablet       . testosterone (ANDRODERM) 4 MG/24HR PT24 patch Place 1 patch onto the skin daily.  30 patch  5  . triazolam (HALCION) 0.25 MG tablet TAKE 1 TABLET BY MOUTH AT BEDTIME AS NEEDED  30 tablet  1   No current facility-administered medications on file prior to visit.    BP 120/66  Pulse 69  Temp(Src) 98.4 F (36.9 C) (Oral)  Resp 20  Ht 5\' 10"  (1.778 m)  Wt 216 lb (97.977 kg)  BMI 30.99 kg/m2  SpO2 98%      Review of Systems  Constitutional: Negative for fever, chills, appetite change and fatigue.  HENT: Positive for postnasal drip, rhinorrhea and sinus pressure. Negative for congestion, dental problem, ear pain, hearing loss, sore throat, tinnitus, trouble swallowing and voice change.   Eyes: Negative for pain, discharge and visual  disturbance.  Respiratory: Negative for cough, chest tightness, wheezing and stridor.   Cardiovascular: Negative for chest pain, palpitations and leg swelling.  Gastrointestinal: Negative for nausea, vomiting, abdominal pain, diarrhea, constipation, blood in stool and abdominal distention.  Genitourinary: Negative for urgency, hematuria, flank pain, discharge, difficulty urinating and genital sores.  Musculoskeletal: Negative for arthralgias, back pain, gait problem, joint swelling, myalgias and neck stiffness.  Skin: Negative for rash.  Neurological: Positive for light-headedness. Negative for dizziness, syncope, speech difficulty, weakness, numbness and headaches.  Hematological: Negative for adenopathy. Does not bruise/bleed easily.  Psychiatric/Behavioral: Negative for behavioral problems and dysphoric mood. The patient is not nervous/anxious.        Objective:   Physical Exam   Constitutional: He is oriented to person, place, and time. He appears well-developed.  HENT:  Head: Normocephalic.  Right Ear: External ear normal.  Left Ear: External ear normal.  Tympanic membranes normal  Eyes: Conjunctivae and EOM are normal.  Neck: Normal range of motion.  Cardiovascular: Normal rate and normal heart sounds.   Pulmonary/Chest: Breath sounds normal.  Abdominal: Bowel sounds are normal.  Musculoskeletal: Normal range of motion. He exhibits no edema and no tenderness.  Neurological: He is alert and oriented to person, place, and time. No cranial nerve deficit.  Psychiatric: He has a normal mood and affect. His behavior is normal.          Assessment & Plan:   Mild positional vertigo.  Possibly aggravated by allergies, and eustachian tube dysfunction.  We'll treat with a daily nonsedating antihistamine.  And, clinically observe.  Was given repositioning exercises to try for positional vertigo Hypertension stable

## 2013-08-27 NOTE — Patient Instructions (Signed)
Call or return to clinic prn if these symptoms worsen or fail to improve as anticipated.  Vertigo Vertigo means you feel like you or your surroundings are moving when they are not. Vertigo can be dangerous if it occurs when you are at work, driving, or performing difficult activities.  CAUSES  Vertigo occurs when there is a conflict of signals sent to your brain from the visual and sensory systems in your body. There are many different causes of vertigo, including:  Infections, especially in the inner ear.  A bad reaction to a drug or misuse of alcohol and medicines.  Withdrawal from drugs or alcohol.  Rapidly changing positions, such as lying down or rolling over in bed.  A migraine headache.  Decreased blood flow to the brain.  Increased pressure in the brain from a head injury, infection, tumor, or bleeding. SYMPTOMS  You may feel as though the world is spinning around or you are falling to the ground. Because your balance is upset, vertigo can cause nausea and vomiting. You may have involuntary eye movements (nystagmus). DIAGNOSIS  Vertigo is usually diagnosed by physical exam. If the cause of your vertigo is unknown, your caregiver may perform imaging tests, such as an MRI scan (magnetic resonance imaging). TREATMENT  Most cases of vertigo resolve on their own, without treatment. Depending on the cause, your caregiver may prescribe certain medicines. If your vertigo is related to body position issues, your caregiver may recommend movements or procedures to correct the problem. In rare cases, if your vertigo is caused by certain inner ear problems, you may need surgery. HOME CARE INSTRUCTIONS   Follow your caregiver's instructions.  Avoid driving.  Avoid operating heavy machinery.  Avoid performing any tasks that would be dangerous to you or others during a vertigo episode.  Tell your caregiver if you notice that certain medicines seem to be causing your vertigo. Some of the  medicines used to treat vertigo episodes can actually make them worse in some people. SEEK IMMEDIATE MEDICAL CARE IF:   Your medicines do not relieve your vertigo or are making it worse.  You develop problems with talking, walking, weakness, or using your arms, hands, or legs.  You develop severe headaches.  Your nausea or vomiting continues or gets worse.  You develop visual changes.  A family member notices behavioral changes.  Your condition gets worse. MAKE SURE YOU:  Understand these instructions.  Will watch your condition.  Will get help right away if you are not doing well or get worse. Document Released: 01/24/2005 Document Revised: 07/09/2011 Document Reviewed: 11/02/2010 [facility] Patient Information 2014 [place], [place].

## 2013-08-27 NOTE — Progress Notes (Signed)
Pre-visit discussion using our clinic review tool. No additional management support is needed unless otherwise documented below in the visit note.  

## 2013-09-30 ENCOUNTER — Telehealth: Payer: Self-pay | Admitting: Internal Medicine

## 2013-09-30 NOTE — Telephone Encounter (Signed)
Left message on voicemail to call office.  

## 2013-09-30 NOTE — Telephone Encounter (Signed)
Pt returned your call would like to be called at [phone]

## 2013-09-30 NOTE — Telephone Encounter (Signed)
Pt would like you to call him concerning rx testosterone (ANDRODERM) 4 MG/24HR PT24 patch Either number OK

## 2013-10-01 NOTE — Telephone Encounter (Signed)
Left message on voicemail to call office.  

## 2013-10-01 NOTE — Telephone Encounter (Signed)
Pt returning your call again, Would like to go back to gel/ Dosage, 2 pumps on each leg.  That would be 2 containers ea mo. Do you want pt to have labs drawn for this? [facility] [address]

## 2013-10-02 MED ORDER — TESTOSTERONE 40.5 MG/2.5GM (1.62%) TD GEL: 2.0000 "application " | Freq: Every day | TRANSDERMAL | Status: DC

## 2013-10-02 NOTE — Telephone Encounter (Signed)
Left detailed message on voicemail Rx for Androgel called into pharmacy. No need to check labs at present, maybe check labs in 3 months to see how working.

## 2013-10-21 ENCOUNTER — Telehealth: Payer: Self-pay | Admitting: Internal Medicine

## 2013-10-21 NOTE — Telephone Encounter (Signed)
Patient Information:  Caller Name: Rosanne AshingJim  Phone: [phone]  Patient: Arvid RightDespain, Jim E  Gender: Male  DOB: 06/12/1950  Age: [age] Years  PCP: Eleonore ChiquitoKwiatkowski, Peter (Family Practice > 6255yrs old)  Office Follow Up:  Does the office need to follow up with this patient?: No  Instructions For The Office: N/A  RN Note:  Patient calling regarding a bite (unknown) now has a bruise the size of silver dollar.  Denies swelling, but noticed yellow 2 puncture bites in center). No pain, swelling or redness.  Symptoms  Reason For Call & Symptoms: spider or tick bite right forearm  Reviewed Health History In EMR: Yes  Reviewed Medications In EMR: Yes  Reviewed Allergies In EMR: Yes  Reviewed Surgeries / Procedures: Yes  Date of Onset of Symptoms: 10/18/2013  Guideline(s) Used:  Insect Bite  Disposition Per Guideline:   See Today or Tomorrow in Office  Reason For Disposition Reached:   Patient wants to be seen  Advice Given:  N/A  Patient Will Follow Care Advice:  YES  Appointment Scheduled:  10/22/2013 08:00:00 Appointment Scheduled Provider:  Eleonore ChiquitoKwiatkowski, Peter (Family Practice > 3755yrs old)

## 2013-10-21 NOTE — Telephone Encounter (Signed)
Pt is on schedule here at office on 10/22/13 with Dr. Kirtland BouchardK.

## 2013-10-22 ENCOUNTER — Ambulatory Visit (INDEPENDENT_AMBULATORY_CARE_PROVIDER_SITE_OTHER): Payer: BC Managed Care – PPO | Admitting: Internal Medicine

## 2013-10-22 ENCOUNTER — Encounter: Payer: Self-pay | Admitting: Internal Medicine

## 2013-10-22 VITALS — BP 130/86 | HR 58 | Temp 98.0°F | Resp 20 | Ht 70.0 in | Wt 212.0 lb

## 2013-10-22 DIAGNOSIS — I10 Essential (primary) hypertension: Secondary | ICD-10-CM

## 2013-10-22 DIAGNOSIS — R233 Spontaneous ecchymoses: Secondary | ICD-10-CM

## 2013-10-22 NOTE — Progress Notes (Signed)
Pre-visit discussion using our clinic review tool. No additional management support is needed unless otherwise documented below in the visit note.  

## 2013-10-22 NOTE — Progress Notes (Signed)
Subjective:    Patient ID: Steven [name], male    DOB: 08/21/1950, 63 y.o.   MRN: [id number]  HPI 63 year old patient who has treated hypertension.  He is seen here today with a concern of a rash involving his right medial forearm.  His wife was treated for possible erythema migrans after tick exposure in the past and he was concerned about this lesion.  4 days ago while doing outdoor activities.  He noted bleeding at this area.  No other symptoms.  He's subsequently developed some bruising  Past Medical History  Diagnosis Date  . Hyperlipidemia   . Hypertension   . Situational stress   . GERD with stricture     History   Social History  . Marital Status: Single    Spouse Name: N/A    Number of Children: N/A  . Years of Education: N/A   Occupational History  . Not on file.   Social History Main Topics  . Smoking status: Never Smoker   . Smokeless tobacco: Never Used  . Alcohol Use: Yes  . Drug Use: No  . Sexual Activity: Not on file   Other Topics Concern  . Not on file   Social History Narrative  . No narrative on file    Past Surgical History  Procedure Laterality Date  . Cardiolite stresstest  fall 2010  . Colonoscopy  fall 2010  . Upper gastrointestinal endoscopy  fall 2010    Family History  Problem Relation Age of Onset  . Cancer Mother     lung   . Lymphoma Father   . Coronary artery disease Sister     Allergies  Allergen Reactions  . Penicillins     unknown    Current Outpatient Prescriptions on File Prior to Visit  Medication Sig Dispense Refill  . atorvastatin (LIPITOR) 40 MG tablet Take 1 tablet (40 mg total) by mouth daily.  90 tablet  3  . ibuprofen (ADVIL,MOTRIN) 800 MG tablet TAKE 1 TABLET BY MOUTH EVERY 8 HOURS DAILY AS NEEDED  30 tablet  1  . lisinopril (PRINIVIL,ZESTRIL) 20 MG tablet TAKE 1 TABLET BY MOUTH EVERY DAY  90 tablet  1  . Multiple Vitamin (MULTIVITAMIN) tablet Take 1 tablet by mouth daily.      . niacin (NIASPAN) 1000  MG CR tablet TAKE 1 TABLET BY MOUTH AT BEDTIME  90 tablet  3  . omeprazole (PRILOSEC) 40 MG capsule TAKE ONE CAPSULE BY MOUTH EVERY DAY  90 capsule  3  . sertraline (ZOLOFT) 50 MG tablet TAKE 1 TABLET BY MOUTH DAILY  90 tablet  3  . simvastatin (ZOCOR) 40 MG tablet       . Testosterone (ANDROGEL) 40.5 MG/2.5GM (1.62%) GEL Place 2 application onto the skin daily.  2.5 g  3  . triazolam (HALCION) 0.25 MG tablet TAKE 1 TABLET BY MOUTH AT BEDTIME AS NEEDED  30 tablet  1   No current facility-administered medications on file prior to visit.    BP 130/86  Pulse 58  Temp(Src) 98 F (36.7 C) (Oral)  Resp 20  Ht 5\' 10"  (1.778 m)  Wt 212 lb (96.163 kg)  BMI 30.42 kg/m2  SpO2 98%      Review of Systems  Skin: Positive for rash.       Objective:   Physical Exam  Constitutional: He appears well-developed and well-nourished. No distress.  Blood pressure 130/80  Skin:  4 cm lesion, right medial forearm that appears to  be a resolving ecchymosis          Assessment & Plan:   Hypertension controlled Tramatic ecchymoses, right medial forearm.  Patient reassured  No change in medication CPX as scheduled

## 2013-10-22 NOTE — Patient Instructions (Signed)
Call or return to clinic prn if these symptoms worsen or fail to improve as anticipated.

## 2013-10-22 NOTE — Progress Notes (Signed)
   Subjective:    Patient ID: Steven [name], male    DOB: 04/09/1951, 63 y.o.   MRN: [id number]  HPI  BP Readings from Last 3 Encounters:  10/22/13 130/86  08/27/13 120/66  06/11/13 110/80     Review of Systems     Objective:   Physical Exam        Assessment & Plan:

## 2013-11-06 ENCOUNTER — Other Ambulatory Visit: Payer: Self-pay | Admitting: Internal Medicine

## 2013-12-03 ENCOUNTER — Other Ambulatory Visit: Payer: Self-pay | Admitting: Internal Medicine

## 2014-01-21 ENCOUNTER — Other Ambulatory Visit: Payer: Self-pay | Admitting: Internal Medicine

## 2014-02-01 ENCOUNTER — Other Ambulatory Visit: Payer: Self-pay | Admitting: Internal Medicine

## 2014-02-15 ENCOUNTER — Other Ambulatory Visit: Payer: Self-pay | Admitting: Internal Medicine

## 2014-03-01 ENCOUNTER — Telehealth: Payer: Self-pay | Admitting: Internal Medicine

## 2014-03-01 NOTE — Telephone Encounter (Signed)
Pt is going to the [place] on a fishing trip/ close to [place]. It is recommended by cdc he get malaria rx.  Also a  yellow fever injection.  Pt would like your opinion on the injection. But a rx for malaria. Pt is leaving this Thurs arriving in [place] on Saturday.  [facility] [place]

## 2014-03-02 MED ORDER — ATOVAQUONE-PROGUANIL HCL 250-100 MG PO TABS: ORAL_TABLET | ORAL | Status: DC

## 2014-03-02 NOTE — Telephone Encounter (Signed)
Please advise 

## 2014-03-02 NOTE — Telephone Encounter (Signed)
Please call in a prescription for Atovaquone-proguanil  To take 1 tablet daily starting  1 day prior to departure and continuing for an additional week following his return

## 2014-03-02 NOTE — Telephone Encounter (Signed)
Left message on voicemail to call office.  

## 2014-03-03 ENCOUNTER — Telehealth: Payer: Self-pay | Admitting: Internal Medicine

## 2014-03-03 MED ORDER — TESTOSTERONE 40.5 MG/2.5GM (1.62%) TD GEL: 2.0000 "application " | Freq: Every day | TRANSDERMAL | Status: DC

## 2014-03-03 NOTE — Telephone Encounter (Signed)
[facility] 

## 2014-03-03 NOTE — Telephone Encounter (Signed)
Spoke to pt, told her Rx for Malaria prevention was sent to pharmacy yesterday, need to start today and take daily for 15 days. Pt verbalized understanding.

## 2014-03-03 NOTE — Telephone Encounter (Signed)
[id number] - [place], [place] - [address] is requesting re-fill on Testosterone (ANDROGEL) 40.5 MG/2.5GM (1.62%) GEL

## 2014-04-07 ENCOUNTER — Encounter: Payer: Self-pay | Admitting: Family Medicine

## 2014-04-07 ENCOUNTER — Ambulatory Visit (INDEPENDENT_AMBULATORY_CARE_PROVIDER_SITE_OTHER): Payer: BC Managed Care – PPO | Admitting: Family Medicine

## 2014-04-07 VITALS — BP 120/80 | Temp 98.0°F | Wt 206.0 lb

## 2014-04-07 DIAGNOSIS — E349 Endocrine disorder, unspecified: Secondary | ICD-10-CM

## 2014-04-07 DIAGNOSIS — N419 Inflammatory disease of prostate, unspecified: Secondary | ICD-10-CM

## 2014-04-07 DIAGNOSIS — E291 Testicular hypofunction: Secondary | ICD-10-CM

## 2014-04-07 LAB — POCT URINALYSIS DIPSTICK
BILIRUBIN UA: NEGATIVE
Glucose, UA: NEGATIVE
KETONES UA: NEGATIVE
LEUKOCYTES UA: NEGATIVE
NITRITE UA: NEGATIVE
Protein, UA: NEGATIVE
RBC UA: NEGATIVE
Spec Grav, UA: 1.01
Urobilinogen, UA: 0.2
pH, UA: 7.5

## 2014-04-07 LAB — TESTOSTERONE: Testosterone: 206.82 ng/dL — ABNORMAL LOW (ref 300.00–890.00)

## 2014-04-07 NOTE — Patient Instructions (Signed)
We will check a testosterone level today  We will call you within a day or 2 with the report

## 2014-04-07 NOTE — Progress Notes (Signed)
   Subjective:    Patient ID: Steven [name], male    DOB: 09/28/1950, 63 y.o.   MRN: [id number]  HPI Fayrene FearingJames is a 63 year old male who comes in today for evaluation of 2 problems  He tells me [place] He was bathing in the river. 6 days ago he had some burning sensation at the base of his scrotum and his rectum. No skin rash does not burn when he please no fever chills or back pain  He had prostatitis 30 years ago  He also has a history of low testosterone was placed on a supplement by Dr. Hilma FavorsK7 months ago. At that time is T level was around 200. He would like a follow-up to level   Review of Systems    review of systems otherwise negative Objective:   Physical Exam  Well-developed well-nourished male no acute distress vital signs stable he is afebrile at examination and genitalia normal circumcised male testes are normal no tenderness in the epididymis no skin rash in the posterior scrotum no masses no tenderness no hernias  Urine normal      Assessment & Plan:  Sensation unknown etiology,,,,, observe  Low testosterone,,,, check T level

## 2014-04-07 NOTE — Progress Notes (Signed)
Pre visit review using our clinic review tool, if applicable. No additional management support is needed unless otherwise documented below in the visit note. 

## 2014-05-11 ENCOUNTER — Telehealth: Payer: Self-pay | Admitting: Internal Medicine

## 2014-05-11 NOTE — Telephone Encounter (Signed)
Pt called to say that the testerone medicine is not working and has questions and would like a call back .

## 2014-05-12 NOTE — Telephone Encounter (Signed)
Dr. Kirtland BouchardK, pt was in to see Dr. Tawanna Coolerodd on 12/9 had Testosterone level checked still low. Pt left voicemail that he would like to change to injections. Please advise if you need to see pt to discuss?

## 2014-05-12 NOTE — Telephone Encounter (Signed)
Left message on voicemail to call office.  

## 2014-05-13 NOTE — Telephone Encounter (Signed)
Spoke to pt, told him Dr. Kirtland BouchardK said he needs to make an appt to discuss Testosterone and injections. Pt verbalized understanding and will call to schedule appt.

## 2014-05-13 NOTE — Telephone Encounter (Signed)
Yes, need ROV to discuss and for injection

## 2014-05-18 ENCOUNTER — Encounter: Payer: Self-pay | Admitting: Internal Medicine

## 2014-05-18 ENCOUNTER — Ambulatory Visit (INDEPENDENT_AMBULATORY_CARE_PROVIDER_SITE_OTHER): Payer: BLUE CROSS/BLUE SHIELD | Admitting: Internal Medicine

## 2014-05-18 VITALS — BP 120/78 | HR 61 | Temp 98.1°F | Resp 20 | Ht 70.0 in | Wt 207.0 lb

## 2014-05-18 DIAGNOSIS — E349 Endocrine disorder, unspecified: Secondary | ICD-10-CM

## 2014-05-18 DIAGNOSIS — E291 Testicular hypofunction: Secondary | ICD-10-CM

## 2014-05-18 DIAGNOSIS — I10 Essential (primary) hypertension: Secondary | ICD-10-CM

## 2014-05-18 MED ORDER — TESTOSTERONE CYPIONATE 200 MG/ML IM SOLN: 100.0000 mg | INTRAMUSCULAR | Status: DC

## 2014-05-18 NOTE — Progress Notes (Signed)
Pre visit review using our clinic review tool, if applicable. No additional management support is needed unless otherwise documented below in the visit note. 

## 2014-05-18 NOTE — Patient Instructions (Signed)
In approximately 4 weeks.  Check a testosterone level one week after an injection

## 2014-06-02 ENCOUNTER — Telehealth: Payer: Self-pay | Admitting: Internal Medicine

## 2014-06-02 NOTE — Telephone Encounter (Signed)
Pt is going on a 3 wk cruise and would like to take a rx for tamiflu w/ him. [facility] [place]

## 2014-06-03 MED ORDER — OSELTAMIVIR PHOSPHATE 75 MG PO CAPS: 75.0000 mg | ORAL_CAPSULE | Freq: Two times a day (BID) | ORAL | Status: DC

## 2014-06-03 NOTE — Telephone Encounter (Signed)
ok 

## 2014-06-03 NOTE — Telephone Encounter (Signed)
Please advise of okay to fill Rx for Tamiflu?

## 2014-06-03 NOTE — Telephone Encounter (Signed)
Pt notified Rx sent to pharmacy for Tamiflu.

## 2014-07-26 ENCOUNTER — Other Ambulatory Visit: Payer: Self-pay | Admitting: Internal Medicine

## 2014-08-03 ENCOUNTER — Other Ambulatory Visit: Payer: Self-pay | Admitting: Internal Medicine

## 2014-08-23 ENCOUNTER — Other Ambulatory Visit (INDEPENDENT_AMBULATORY_CARE_PROVIDER_SITE_OTHER): Payer: BLUE CROSS/BLUE SHIELD

## 2014-08-23 ENCOUNTER — Other Ambulatory Visit: Payer: Self-pay | Admitting: Internal Medicine

## 2014-08-23 DIAGNOSIS — E291 Testicular hypofunction: Secondary | ICD-10-CM | POA: Diagnosis not present

## 2014-08-23 DIAGNOSIS — E349 Endocrine disorder, unspecified: Secondary | ICD-10-CM

## 2014-08-23 LAB — TESTOSTERONE: TESTOSTERONE: 123.83 ng/dL — AB (ref 300.00–890.00)

## 2014-09-03 ENCOUNTER — Other Ambulatory Visit: Payer: Self-pay | Admitting: Internal Medicine

## 2014-11-17 ENCOUNTER — Telehealth: Payer: Self-pay | Admitting: Internal Medicine

## 2014-11-17 DIAGNOSIS — Z0000 Encounter for general adult medical examination without abnormal findings: Secondary | ICD-10-CM

## 2014-11-17 DIAGNOSIS — I10 Essential (primary) hypertension: Secondary | ICD-10-CM

## 2014-11-17 DIAGNOSIS — R7302 Impaired glucose tolerance (oral): Secondary | ICD-10-CM

## 2014-11-17 DIAGNOSIS — E785 Hyperlipidemia, unspecified: Secondary | ICD-10-CM

## 2014-11-17 DIAGNOSIS — E349 Endocrine disorder, unspecified: Secondary | ICD-10-CM

## 2014-11-17 NOTE — Telephone Encounter (Signed)
Steven [name], pt is overdue for Physical, please schedule. I put all lab orders in [facility] for physical and Testosterone level. Please tell pt to fast for lab work. Thanks

## 2014-11-17 NOTE — Telephone Encounter (Signed)
LMOM for pt to call and schedule CPX.  Please see message below.

## 2014-11-17 NOTE — Telephone Encounter (Signed)
Patient would like orders entered to have his testosterone level checked and he also mentioned wanting to have lipids and CBC checked.  Is it okay to schedule after orders have been entered?

## 2014-11-18 ENCOUNTER — Other Ambulatory Visit (INDEPENDENT_AMBULATORY_CARE_PROVIDER_SITE_OTHER): Payer: BLUE CROSS/BLUE SHIELD

## 2014-11-18 DIAGNOSIS — E291 Testicular hypofunction: Secondary | ICD-10-CM

## 2014-11-18 DIAGNOSIS — R7302 Impaired glucose tolerance (oral): Secondary | ICD-10-CM

## 2014-11-18 DIAGNOSIS — I10 Essential (primary) hypertension: Secondary | ICD-10-CM | POA: Diagnosis not present

## 2014-11-18 DIAGNOSIS — Z0000 Encounter for general adult medical examination without abnormal findings: Secondary | ICD-10-CM

## 2014-11-18 DIAGNOSIS — E349 Endocrine disorder, unspecified: Secondary | ICD-10-CM

## 2014-11-18 DIAGNOSIS — E785 Hyperlipidemia, unspecified: Secondary | ICD-10-CM

## 2014-11-18 LAB — CBC WITH DIFFERENTIAL/PLATELET
Basophils Absolute: 0 10*3/uL (ref 0.0–0.1)
Basophils Relative: 0.3 % (ref 0.0–3.0)
EOS ABS: 0 10*3/uL (ref 0.0–0.7)
Eosinophils Relative: 0.6 % (ref 0.0–5.0)
HEMATOCRIT: 46.5 % (ref 39.0–52.0)
Hemoglobin: 15.4 g/dL (ref 13.0–17.0)
Lymphocytes Relative: 29.6 % (ref 12.0–46.0)
Lymphs Abs: 2.1 10*3/uL (ref 0.7–4.0)
MCHC: 33.3 g/dL (ref 30.0–36.0)
MCV: 89.3 fl (ref 78.0–100.0)
Monocytes Absolute: 1 10*3/uL (ref 0.1–1.0)
Monocytes Relative: 14.5 % — ABNORMAL HIGH (ref 3.0–12.0)
Neutro Abs: 3.8 10*3/uL (ref 1.4–7.7)
Neutrophils Relative %: 55 % (ref 43.0–77.0)
Platelets: 254 10*3/uL (ref 150.0–400.0)
RBC: 5.2 Mil/uL (ref 4.22–5.81)
RDW: 14 % (ref 11.5–15.5)
WBC: 7 10*3/uL (ref 4.0–10.5)

## 2014-11-18 LAB — HEPATIC FUNCTION PANEL
ALT: 20 U/L (ref 0–53)
AST: 21 U/L (ref 0–37)
Albumin: 4.6 g/dL (ref 3.5–5.2)
Alkaline Phosphatase: 77 U/L (ref 39–117)
Bilirubin, Direct: 0.2 mg/dL (ref 0.0–0.3)
Total Bilirubin: 0.9 mg/dL (ref 0.2–1.2)
Total Protein: 6.7 g/dL (ref 6.0–8.3)

## 2014-11-18 LAB — LIPID PANEL
Cholesterol: 182 mg/dL (ref 0–200)
HDL: 46.7 mg/dL (ref >39.00)
LDL CALC: 104 mg/dL — AB (ref 0–99)
NonHDL: 135.3
Total CHOL/HDL Ratio: 4
Triglycerides: 156 mg/dL — ABNORMAL HIGH (ref 0.0–149.0)
VLDL: 31.2 mg/dL (ref 0.0–40.0)

## 2014-11-18 LAB — BASIC METABOLIC PANEL
BUN: 12 mg/dL (ref 6–23)
CO2: 29 meq/L (ref 19–32)
Calcium: 9.6 mg/dL (ref 8.4–10.5)
Chloride: 101 mEq/L (ref 96–112)
Creatinine, Ser: 1 mg/dL (ref 0.40–1.50)
GFR: 80.09 mL/min (ref >60.00)
Glucose, Bld: 113 mg/dL — ABNORMAL HIGH (ref 70–99)
Potassium: 4.3 mEq/L (ref 3.5–5.1)
SODIUM: 138 meq/L (ref 135–145)

## 2014-11-18 LAB — TSH: TSH: 2.18 u[IU]/mL (ref 0.35–4.50)

## 2014-11-18 LAB — PSA: PSA: 1.9 ng/mL (ref 0.10–4.00)

## 2014-11-18 LAB — TESTOSTERONE: Testosterone: 765.56 ng/dL (ref 300.00–890.00)

## 2014-11-19 ENCOUNTER — Other Ambulatory Visit: Payer: BLUE CROSS/BLUE SHIELD

## 2014-11-22 ENCOUNTER — Telehealth: Payer: Self-pay | Admitting: Internal Medicine

## 2014-11-22 NOTE — Telephone Encounter (Signed)
Pt would like a copy of labs done on 11/18/14 sent to his billing address

## 2014-11-22 NOTE — Telephone Encounter (Signed)
Copy of Labs mailed to pt. 

## 2014-11-24 ENCOUNTER — Telehealth: Payer: Self-pay | Admitting: Internal Medicine

## 2014-11-24 NOTE — Telephone Encounter (Signed)
Pt needs blood work results °

## 2014-11-25 NOTE — Telephone Encounter (Signed)
Spoke to pt, told him lab results were normal and Testosterone level 765 which is in a high normal range, Cholesterol 182 per Dr.K. Pt verbalized understanding.

## 2014-11-25 NOTE — Telephone Encounter (Signed)
Please call/notify patient that lab/test/procedure is normal  Testosterone level 765  which is in a high normal range Cholesterol 182

## 2014-11-25 NOTE — Telephone Encounter (Signed)
Pt calling for lab results, please advise. 

## 2014-12-27 ENCOUNTER — Other Ambulatory Visit: Payer: Self-pay | Admitting: Internal Medicine

## 2015-01-12 ENCOUNTER — Other Ambulatory Visit: Payer: Self-pay | Admitting: Internal Medicine

## 2015-01-18 ENCOUNTER — Encounter: Payer: Self-pay | Admitting: Internal Medicine

## 2015-01-18 ENCOUNTER — Ambulatory Visit (INDEPENDENT_AMBULATORY_CARE_PROVIDER_SITE_OTHER): Payer: BLUE CROSS/BLUE SHIELD | Admitting: Internal Medicine

## 2015-01-18 VITALS — BP 116/70 | HR 65 | Temp 98.4°F | Resp 20 | Ht 69.75 in | Wt 210.0 lb

## 2015-01-18 DIAGNOSIS — Z0000 Encounter for general adult medical examination without abnormal findings: Secondary | ICD-10-CM

## 2015-01-18 DIAGNOSIS — R7302 Impaired glucose tolerance (oral): Secondary | ICD-10-CM

## 2015-01-18 DIAGNOSIS — Z23 Encounter for immunization: Secondary | ICD-10-CM

## 2015-01-18 DIAGNOSIS — I10 Essential (primary) hypertension: Secondary | ICD-10-CM | POA: Diagnosis not present

## 2015-01-18 MED ORDER — TRIAZOLAM 0.25 MG PO TABS: 0.2500 mg | ORAL_TABLET | Freq: Every evening | ORAL | Status: DC | PRN

## 2015-01-18 MED ORDER — TESTOSTERONE CYPIONATE 200 MG/ML IM SOLN: INTRAMUSCULAR | Status: DC

## 2015-01-18 NOTE — Progress Notes (Signed)
Pre visit review using our clinic review tool, if applicable. No additional management support is needed unless otherwise documented below in the visit note. 

## 2015-01-18 NOTE — Progress Notes (Signed)
Subjective:    Patient ID: Steven [name], male    DOB: [birth year], 64 y.o.   MRN: [id number]  HPI  64 year-old patient who is seen today for a health maintenance exam.  He has a history of hypertension dyslipidemia and mild impaired glucose tolerance. He has gastro-esophageal reflux disease. He is doing quite well today. No concerns or complaints. Patient has been on sertraline for an anxiety disorder which has been quite helpful. His last colonoscopy was approximately 6  years ago.   Past Medical History  Diagnosis Date  . Hyperlipidemia   . Hypertension   . Situational stress   . GERD with stricture     Social History   Social History  . Marital Status: Single    Spouse Name: N/A  . Number of Children: N/A  . Years of Education: N/A   Occupational History  . Not on file.   Social History Main Topics  . Smoking status: Never Smoker   . Smokeless tobacco: Never Used  . Alcohol Use: Yes  . Drug Use: No  . Sexual Activity: Not on file   Other Topics Concern  . Not on file   Social History Narrative    Past Surgical History  Procedure Laterality Date  . Cardiolite stresstest  fall 2010  . Colonoscopy  fall 2010  . Upper gastrointestinal endoscopy  fall 2010    Family History  Problem Relation Age of Onset  . Cancer Mother     lung   . Lymphoma Father   . Coronary artery disease Sister     Allergies  Allergen Reactions  . Penicillins     unknown    Current Outpatient Prescriptions on File Prior to Visit  Medication Sig Dispense Refill  . atorvastatin (LIPITOR) 40 MG tablet Take 1 tablet (40 mg total) by mouth daily. 90 tablet 3  . ibuprofen (ADVIL,MOTRIN) 800 MG tablet TAKE 1 TABLET BY MOUTH EVERY 8 HOURS AS NEEDED 30 tablet 1  . lisinopril (PRINIVIL,ZESTRIL) 20 MG tablet TAKE 1 TABLET BY MOUTH EVERY DAY 90 tablet 1  . Multiple Vitamin (MULTIVITAMIN) tablet Take 1 tablet by mouth daily.    . niacin (NIASPAN) 1000 MG CR tablet TAKE 1 TABLET BY MOUTH  AT BEDTIME 90 tablet 3  . omeprazole (PRILOSEC) 40 MG capsule TAKE ONE CAPSULE BY MOUTH EVERY DAY 90 capsule 3  . oseltamivir (TAMIFLU) 75 MG capsule Take 1 capsule (75 mg total) by mouth 2 (two) times daily. 10 capsule 0  . sertraline (ZOLOFT) 50 MG tablet TAKE 1 TABLET BY MOUTH DAILY 90 tablet 3  . simvastatin (ZOCOR) 40 MG tablet     . simvastatin (ZOCOR) 40 MG tablet TAKE 1 TABLET BY MOUTH EVERY EVENING 90 tablet 1  . testosterone cypionate (DEPOTESTOTERONE CYPIONATE) 200 MG/ML injection INJECT 1/2ML INTO MUSCLE EVERY 14 DAYS (Patient taking differently: INJECT 1 and 1/2ML INTO MUSCLE EVERY 14 DAYS) 10 mL 5  . triazolam (HALCION) 0.25 MG tablet TAKE 1 TABLET BY MOUTH AT BEDTIME AS NEEDED 30 tablet 2   No current facility-administered medications on file prior to visit.    BP 116/70 mmHg  Pulse 65  Temp(Src) 98.4 F (36.9 C) (Oral)  Resp 20  Ht 5' 9.75" (1.772 m)  Wt 210 lb (95.255 kg)  BMI 30.34 kg/m2  SpO2 97%   Past Medical History:   Hyperlipidemia  Hypertension  situational stress  GERD/stricture  Testosterone deficiency  Past Surgical History:   Cardiolite stress test, fall 2010  colonoscopy upper endoscopy, fall 2010     Review of Systems  Constitutional: Negative for fever, chills, activity change, appetite change and fatigue.  HENT: Negative for congestion, dental problem, ear pain, hearing loss, mouth sores, rhinorrhea, sinus pressure, sneezing, tinnitus, trouble swallowing and voice change.   Eyes: Negative for photophobia, pain, redness and visual disturbance.  Respiratory: Negative for apnea, cough, choking, chest tightness, shortness of breath and wheezing.   Cardiovascular: Negative for chest pain, palpitations and leg swelling.  Gastrointestinal: Negative for nausea, vomiting, abdominal pain, diarrhea, constipation, blood in stool, abdominal distention, anal bleeding and rectal pain.  Genitourinary: Negative for dysuria, urgency, frequency, hematuria,  flank pain, decreased urine volume, discharge, penile swelling, scrotal swelling, difficulty urinating, genital sores and testicular pain.  Musculoskeletal: Negative for myalgias, back pain, joint swelling, arthralgias, gait problem, neck pain and neck stiffness.  Skin: Negative for color change, rash and wound.  Neurological: Negative for dizziness, tremors, seizures, syncope, facial asymmetry, speech difficulty, weakness, light-headedness, numbness and headaches.  Hematological: Negative for adenopathy. Does not bruise/bleed easily.  Psychiatric/Behavioral: Negative for suicidal ideas, hallucinations, behavioral problems, confusion, sleep disturbance, self-injury, dysphoric mood, decreased concentration and agitation. The patient is not nervous/anxious.        Objective:   Physical Exam  Constitutional: He appears well-developed and well-nourished.  HENT:  Head: Normocephalic and atraumatic.  Right Ear: External ear normal.  Left Ear: External ear normal.  Nose: Nose normal.  Mouth/Throat: Oropharynx is clear and moist.  Eyes: Conjunctivae and EOM are normal. Pupils are equal, round, and reactive to light. No scleral icterus.  Neck: Normal range of motion. Neck supple. No JVD present. No thyromegaly present.  Cardiovascular: Regular rhythm, normal heart sounds and intact distal pulses.  Exam reveals no gallop and no friction rub.   No murmur heard. Pulmonary/Chest: Effort normal and breath sounds normal. He exhibits no tenderness.  Abdominal: Soft. Bowel sounds are normal. He exhibits no distension and no mass. There is no tenderness.  Genitourinary: Prostate normal and penis normal.  Musculoskeletal: Normal range of motion. He exhibits no edema or tenderness.  Lymphadenopathy:    He has no cervical adenopathy.  Neurological: He is alert. He has normal reflexes. No cranial nerve deficit. Coordination normal.  Skin: Skin is warm and dry. No rash noted.  Psychiatric: He has a normal  mood and affect. His behavior is normal.          Assessment & Plan:   Preventive health examination Hypertension well controlled Dyslipidemia.  Testosterone deficiency. The patient is asymptomatic and denies any ED or libido issues. He was given samples for transdermal replacement testosterone to try for 30 days.  Weight loss encouraged Regular exercise encouraged

## 2015-01-18 NOTE — Patient Instructions (Signed)
Limit your sodium (Salt) intake  Please check your blood pressure on a regular basis.  If it is consistently greater than 150/90, please make an office appointment.  Health Maintenance A healthy lifestyle and preventative care can promote health and wellness.  Maintain regular health, dental, and eye exams.  Eat a healthy diet. Foods like vegetables, fruits, whole grains, low-fat dairy products, and lean protein foods contain the nutrients you need and are low in calories. Decrease your intake of foods high in solid fats, added sugars, and salt. Get information about a proper diet from your health care provider, if necessary.  Regular physical exercise is one of the most important things you can do for your health. Most adults should get at least 150 minutes of moderate-intensity exercise (any activity that increases your heart rate and causes you to sweat) each week. In addition, most adults need muscle-strengthening exercises on 2 or more days a week.   Maintain a healthy weight. The body mass index (BMI) is a screening tool to identify possible weight problems. It provides an estimate of body fat based on height and weight. Your health care provider can find your BMI and can help you achieve or maintain a healthy weight. For males 20 years and older:  A BMI below 18.5 is considered underweight.  A BMI of 18.5 to 24.9 is normal.  A BMI of 25 to 29.9 is considered overweight.  A BMI of 30 and above is considered obese.  Maintain normal blood lipids and cholesterol by exercising and minimizing your intake of saturated fat. Eat a balanced diet with plenty of fruits and vegetables. Blood tests for lipids and cholesterol should begin at age 20 and be repeated every 5 years. If your lipid or cholesterol levels are high, you are over age 50, or you are at high risk for heart disease, you may need your cholesterol levels checked more frequently.Ongoing high lipid and cholesterol levels should be  treated with medicines if diet and exercise are not working.  If you smoke, find out from your health care provider how to quit. If you do not use tobacco, do not start.  Lung cancer screening is recommended for adults aged 55-80 years who are at high risk for developing lung cancer because of a history of smoking. A yearly low-dose CT scan of the lungs is recommended for people who have at least a 30-pack-year history of smoking and are current smokers or have quit within the past 15 years. A pack year of smoking is smoking an average of 1 pack of cigarettes a day for 1 year (for example, a 30-pack-year history of smoking could mean smoking 1 pack a day for 30 years or 2 packs a day for 15 years). Yearly screening should continue until the smoker has stopped smoking for at least 15 years. Yearly screening should be stopped for people who develop a health problem that would prevent them from having lung cancer treatment.  If you choose to drink alcohol, do not have more than 2 drinks per day. One drink is considered to be 12 oz (360 mL) of beer, 5 oz (150 mL) of wine, or 1.5 oz (45 mL) of liquor.  Avoid the use of street drugs. Do not share needles with anyone. Ask for help if you need support or instructions about stopping the use of drugs.  High blood pressure causes heart disease and increases the risk of stroke. Blood pressure should be checked at least every 1-2 years. Ongoing   high blood pressure should be treated with medicines if weight loss and exercise are not effective.  If you are 45-79 years old, ask your health care provider if you should take aspirin to prevent heart disease.  Diabetes screening involves taking a blood sample to check your fasting blood sugar level. This should be done once every 3 years after age 45 if you are at a normal weight and without risk factors for diabetes. Testing should be considered at a younger age or be carried out more frequently if you are overweight and  have at least 1 risk factor for diabetes.  Colorectal cancer can be detected and often prevented. Most routine colorectal cancer screening begins at the age of 50 and continues through age 75. However, your health care provider may recommend screening at an earlier age if you have risk factors for colon cancer. On a yearly basis, your health care provider may provide home test kits to check for hidden blood in the stool. A small camera at the end of a tube may be used to directly examine the colon (sigmoidoscopy or colonoscopy) to detect the earliest forms of colorectal cancer. Talk to your health care provider about this at age 50 when routine screening begins. A direct exam of the colon should be repeated every 5-10 years through age 75, unless early forms of precancerous polyps or small growths are found.  People who are at an increased risk for hepatitis B should be screened for this virus. You are considered at high risk for hepatitis B if:  You were born in a country where hepatitis B occurs often. Talk with your health care provider about which countries are considered high risk.  Your parents were born in a high-risk country and you have not received a shot to protect against hepatitis B (hepatitis B vaccine).  You have HIV or AIDS.  You use needles to inject street drugs.  You live with, or have sex with, someone who has hepatitis B.  You are a man who has sex with other men (MSM).  You get hemodialysis treatment.  You take certain medicines for conditions like cancer, organ transplantation, and autoimmune conditions.  Hepatitis C blood testing is recommended for all people born from 1945 through 1965 and any individual with known risk factors for hepatitis C.  Healthy men should no longer receive prostate-specific antigen (PSA) blood tests as part of routine cancer screening. Talk to your health care provider about prostate cancer screening.  Testicular cancer screening is not  recommended for adolescents or adult males who have no symptoms. Screening includes self-exam, a health care provider exam, and other screening tests. Consult with your health care provider about any symptoms you have or any concerns you have about testicular cancer.  Practice safe sex. Use condoms and avoid high-risk sexual practices to reduce the spread of sexually transmitted infections (STIs).  You should be screened for STIs, including gonorrhea and chlamydia if:  You are sexually active and are younger than 24 years.  You are older than 24 years, and your health care provider tells you that you are at risk for this type of infection.  Your sexual activity has changed since you were last screened, and you are at an increased risk for chlamydia or gonorrhea. Ask your health care provider if you are at risk.  If you are at risk of being infected with HIV, it is recommended that you take a prescription medicine daily to prevent HIV infection. This is   called pre-exposure prophylaxis (PrEP). You are considered at risk if:  You are a man who has sex with other men (MSM).  You are a heterosexual man who is sexually active with multiple partners.  You take drugs by injection.  You are sexually active with a partner who has HIV.  Talk with your health care provider about whether you are at high risk of being infected with HIV. If you choose to begin PrEP, you should first be tested for HIV. You should then be tested every 3 months for as long as you are taking PrEP.  Use sunscreen. Apply sunscreen liberally and repeatedly throughout the day. You should seek shade when your shadow is shorter than you. Protect yourself by wearing long sleeves, pants, a wide-brimmed hat, and sunglasses year round whenever you are outdoors.  Tell your health care provider of new moles or changes in moles, especially if there is a change in shape or color. Also, tell your health care provider if a mole is larger  than the size of a pencil eraser.  A one-time screening for abdominal aortic aneurysm (AAA) and surgical repair of large AAAs by ultrasound is recommended for men aged 65-75 years who are current or former smokers.  Stay current with your vaccines (immunizations). Document Released: 10/13/2007 Document Revised: 04/21/2013 Document Reviewed: 09/11/2010 [employer] Patient Information 2015 [employer]. This information is not intended to replace advice given to you by your health care provider. Make sure you discuss any questions you have with your health care provider.  

## 2015-01-24 ENCOUNTER — Other Ambulatory Visit: Payer: Self-pay | Admitting: Internal Medicine

## 2015-01-25 ENCOUNTER — Other Ambulatory Visit: Payer: Self-pay | Admitting: Internal Medicine

## 2015-02-23 ENCOUNTER — Other Ambulatory Visit: Payer: Self-pay | Admitting: Internal Medicine

## 2015-03-10 NOTE — Telephone Encounter (Signed)
Lupita LeashDonna, will you close this note for me? It will not let me, thanks

## 2015-03-26 ENCOUNTER — Other Ambulatory Visit: Payer: Self-pay | Admitting: Internal Medicine

## 2015-04-05 ENCOUNTER — Telehealth: Payer: Self-pay | Admitting: Internal Medicine

## 2015-04-05 ENCOUNTER — Other Ambulatory Visit: Payer: Self-pay | Admitting: Internal Medicine

## 2015-04-05 MED ORDER — TESTOSTERONE CYPIONATE 200 MG/ML IM SOLN: 300.0000 mg | INTRAMUSCULAR | Status: DC

## 2015-04-05 NOTE — Telephone Encounter (Signed)
[facility] - Client TELEPHONE ADVICE RECORD [facility] Patient Name: Steven [name] DOB: 06/27/1950 Initial Comment Caller needs to talk to a nurse regarding a dosage of medication. Testerone Nurse Assessment Nurse: Roderic [place], RN, Amy Date/Time Lamount Cohen(Eastern Time): 04/05/2015 10:30:32 AM Please select the assessment type ---Refill Additional Documentation ---CALLER STATES THAT HIS PRESCRIPTION FOR TESTOSTERONE NEEDS TO BE UPDATED. HE STATES THAT THE LAST INFORMATION HE HAD REGARDING HIS INJECTIONS WAS TO GIVE HIMSELF 1.5ML EVERY 2 WEEKS. HE WENT TO GET THE MEDICATION AT THE PHARMACY AND IT SHOWS TO GIVE HIMSELF 0.5 ML AND THEY GAVE HIM SINGLE DOSE INJECTION VIALS AND HE DOES NOT HAVE ENOUGH TO USE FOR THE CORRECT DOSAGE. HE IS WANTING TO GET THIS FIXED. INFORMED HIM THAT TRIAGE NURSE WOULD SEND THIS REQUEST IN FOR HIM FOR THE PHARMACY TO GET AN UPDATE SCRIPT. [facility] IN [place]. Does the patient have enough medication to last until the office opens? ---No Guidelines Guideline Title Affirmed Question Affirmed Notes Final Disposition User Clinical Call [place], RN, Amy

## 2015-04-05 NOTE — Telephone Encounter (Signed)
Spoke to pt, told him I called new Rx into pharmacy for him and they should dispense a 10 ml via to you. Pt verbalized understanding.

## 2015-04-08 ENCOUNTER — Other Ambulatory Visit: Payer: Self-pay | Admitting: Internal Medicine

## 2015-05-03 ENCOUNTER — Encounter: Payer: Self-pay | Admitting: Internal Medicine

## 2015-05-03 ENCOUNTER — Ambulatory Visit (INDEPENDENT_AMBULATORY_CARE_PROVIDER_SITE_OTHER): Payer: 59 | Admitting: Internal Medicine

## 2015-05-03 VITALS — BP 130/80 | HR 66 | Resp 20 | Ht 69.75 in | Wt 215.0 lb

## 2015-05-03 DIAGNOSIS — N4282 Prostatosis syndrome: Secondary | ICD-10-CM

## 2015-05-03 DIAGNOSIS — I10 Essential (primary) hypertension: Secondary | ICD-10-CM

## 2015-05-03 MED ORDER — CIPROFLOXACIN HCL 500 MG PO TABS: 500.0000 mg | ORAL_TABLET | Freq: Two times a day (BID) | ORAL | Status: DC

## 2015-05-03 NOTE — Progress Notes (Signed)
Pre visit review using our clinic review tool, if applicable. No additional management support is needed unless otherwise documented below in the visit note. 

## 2015-05-03 NOTE — Progress Notes (Signed)
Subjective:    Patient ID: Steven [name], male    DOB: 10/01/1950, 65 y.o.   MRN: [id number]  HPI  65 year old patient who presents with a history of perineal discomfort.  He states that he was treated for acute prostatitis greater than 20 years ago.  He was seen at an urgent care recently and has completed 10 days of Cipro.  He still has some symptoms but perhaps modestly improved No fever or voiding issues.  He states pain is aggravated by prolonged sitting.  No change in bowel habits  Past Medical History  Diagnosis Date  . Hyperlipidemia   . Hypertension   . Situational stress   . GERD with stricture     Social History   Social History  . Marital Status: Single    Spouse Name: N/A  . Number of Children: N/A  . Years of Education: N/A   Occupational History  . Not on file.   Social History Main Topics  . Smoking status: Never Smoker   . Smokeless tobacco: Never Used  . Alcohol Use: Yes  . Drug Use: No  . Sexual Activity: Not on file   Other Topics Concern  . Not on file   Social History Narrative    Past Surgical History  Procedure Laterality Date  . Cardiolite stresstest  fall 2010  . Colonoscopy  fall 2010  . Upper gastrointestinal endoscopy  fall 2010    Family History  Problem Relation Age of Onset  . Cancer Mother     lung   . Lymphoma Father   . Coronary artery disease Sister     Allergies  Allergen Reactions  . Penicillins     unknown    Current Outpatient Prescriptions on File Prior to Visit  Medication Sig Dispense Refill  . ibuprofen (ADVIL,MOTRIN) 800 MG tablet TAKE 1 TABLET BY MOUTH EVERY 8 HOURS AS NEEDED 30 tablet 2  . lisinopril (PRINIVIL,ZESTRIL) 20 MG tablet TAKE 1 TABLET BY MOUTH EVERY DAY 90 tablet 1  . Multiple Vitamin (MULTIVITAMIN) tablet Take 1 tablet by mouth daily.    . niacin (NIASPAN) 1000 MG CR tablet TAKE 1 TABLET BY MOUTH AT BEDTIME 90 tablet 3  . omeprazole (PRILOSEC) 40 MG capsule TAKE 1 CAPSULE BY MOUTH EVERY  DAY 90 capsule 3  . sertraline (ZOLOFT) 50 MG tablet TAKE 1 TABLET BY MOUTH DAILY 90 tablet 3  . simvastatin (ZOCOR) 40 MG tablet TAKE 1 TABLET BY MOUTH EVERY EVENING 90 tablet 1  . testosterone cypionate (DEPOTESTOSTERONE CYPIONATE) 200 MG/ML injection Inject 1.5 mLs (300 mg total) into the muscle every 14 (fourteen) days. 10 mL 1  . triazolam (HALCION) 0.25 MG tablet Take 1 tablet (0.25 mg total) by mouth at bedtime as needed. 30 tablet 5   No current facility-administered medications on file prior to visit.    BP 130/80 mmHg  Pulse 66  Resp 20  Ht 5' 9.75" (1.772 m)  Wt 215 lb (97.523 kg)  BMI 31.06 kg/m2  SpO2 99%     Review of Systems  Constitutional: Negative for fever, chills, appetite change and fatigue.  HENT: Negative for congestion, dental problem, ear pain, hearing loss, sore throat, tinnitus, trouble swallowing and voice change.   Eyes: Negative for pain, discharge and visual disturbance.  Respiratory: Negative for cough, chest tightness, wheezing and stridor.   Cardiovascular: Negative for chest pain, palpitations and leg swelling.  Gastrointestinal: Negative for nausea, vomiting, abdominal pain, diarrhea, constipation, blood in stool and abdominal  distention.  Genitourinary: Negative for urgency, hematuria, flank pain, discharge, difficulty urinating and genital sores.  Musculoskeletal: Negative for myalgias, back pain, joint swelling, arthralgias, gait problem and neck stiffness.  Skin: Negative for rash.  Neurological: Negative for dizziness, syncope, speech difficulty, weakness, numbness and headaches.  Hematological: Negative for adenopathy. Does not bruise/bleed easily.  Psychiatric/Behavioral: Negative for behavioral problems and dysphoric mood. The patient is not nervous/anxious.        Objective:   Physical Exam  Constitutional: He appears well-developed and well-nourished. No distress.  Genitourinary:  A few external hemorrhoidal tags No evidence of  perirectal abscess or internal hemorrhoids Prostate minimally enlarged, firm and nontender          Assessment & Plan:   Perineal pain History of prostatitis Possibly partially treated prostatitis.  Options discussed including observation.  Will continue ibuprofen and complete 30 days of Cipro.  Will call if unimproved

## 2015-05-03 NOTE — Patient Instructions (Signed)

## 2015-05-23 ENCOUNTER — Other Ambulatory Visit: Payer: Self-pay | Admitting: Internal Medicine

## 2015-05-23 NOTE — Telephone Encounter (Signed)
ok 

## 2015-05-23 NOTE — Telephone Encounter (Signed)
Please advise if okay to refill Cipro? 

## 2015-06-01 ENCOUNTER — Ambulatory Visit (INDEPENDENT_AMBULATORY_CARE_PROVIDER_SITE_OTHER): Payer: 59 | Admitting: Internal Medicine

## 2015-06-01 ENCOUNTER — Encounter: Payer: Self-pay | Admitting: Internal Medicine

## 2015-06-01 VITALS — BP 138/100 | HR 71 | Temp 98.5°F | Resp 20 | Ht 69.75 in | Wt 204.0 lb

## 2015-06-01 DIAGNOSIS — N5089 Other specified disorders of the male genital organs: Secondary | ICD-10-CM

## 2015-06-01 DIAGNOSIS — N509 Disorder of male genital organs, unspecified: Secondary | ICD-10-CM

## 2015-06-01 DIAGNOSIS — I10 Essential (primary) hypertension: Secondary | ICD-10-CM

## 2015-06-01 NOTE — Progress Notes (Signed)
Subjective:    Patient ID: Steven [name], male    DOB: 1951-02-27, 65 y.o.   MRN: [id number]  HPI  65 year old patient who is seen today with persistent perineal pain.  He was seen about 1 month ago after a visit at the urgent care and it was elected to treat for 30 days for possible subacute prostatitis.  He has not improved.  He continues to have discomfort in the perineal area.  The pain seems maximal over the left ischial tuberosity, but spreads to the inner thigh perineal area and posterior upper thigh region.  Pain is aggravated by prolonged sitting and alleviated by lying on either his left or right side.  Pain is not aggravated and seems to be relieved by activity such as standing and walking. He has tried a number of stretching and range of motion exercises without much benefit.  None of these activities tend aggravate the discomfort  Past Medical History  Diagnosis Date  . Hyperlipidemia   . Hypertension   . Situational stress   . GERD with stricture     Social History   Social History  . Marital Status: Single    Spouse Name: N/A  . Number of Children: N/A  . Years of Education: N/A   Occupational History  . Not on file.   Social History Main Topics  . Smoking status: Never Smoker   . Smokeless tobacco: Never Used  . Alcohol Use: Yes  . Drug Use: No  . Sexual Activity: Not on file   Other Topics Concern  . Not on file   Social History Narrative    Past Surgical History  Procedure Laterality Date  . Cardiolite stresstest  fall 2010  . Colonoscopy  fall 2010  . Upper gastrointestinal endoscopy  fall 2010    Family History  Problem Relation Age of Onset  . Cancer Mother     lung   . Lymphoma Father   . Coronary artery disease Sister     Allergies  Allergen Reactions  . Penicillins     unknown    Current Outpatient Prescriptions on File Prior to Visit  Medication Sig Dispense Refill  . ciprofloxacin (CIPRO) 500 MG tablet TAKE 1 TABLET BY MOUTH  TWICE DAILY 40 tablet 0  . ibuprofen (ADVIL,MOTRIN) 800 MG tablet TAKE 1 TABLET BY MOUTH EVERY 8 HOURS AS NEEDED 30 tablet 2  . lisinopril (PRINIVIL,ZESTRIL) 20 MG tablet TAKE 1 TABLET BY MOUTH EVERY DAY 90 tablet 1  . Multiple Vitamin (MULTIVITAMIN) tablet Take 1 tablet by mouth daily.    . niacin (NIASPAN) 1000 MG CR tablet TAKE 1 TABLET BY MOUTH AT BEDTIME 90 tablet 3  . omeprazole (PRILOSEC) 40 MG capsule TAKE 1 CAPSULE BY MOUTH EVERY DAY 90 capsule 3  . sertraline (ZOLOFT) 50 MG tablet TAKE 1 TABLET BY MOUTH DAILY 90 tablet 3  . simvastatin (ZOCOR) 40 MG tablet TAKE 1 TABLET BY MOUTH EVERY EVENING 90 tablet 1  . testosterone cypionate (DEPOTESTOSTERONE CYPIONATE) 200 MG/ML injection Inject 1.5 mLs (300 mg total) into the muscle every 14 (fourteen) days. 10 mL 1  . triazolam (HALCION) 0.25 MG tablet Take 1 tablet (0.25 mg total) by mouth at bedtime as needed. 30 tablet 5   No current facility-administered medications on file prior to visit.    BP 138/100 mmHg  Pulse 71  Temp(Src) 98.5 F (36.9 C) (Oral)  Resp 20  Ht 5' 9.75" (1.772 m)  Wt 204 lb (92.534 kg)  BMI 29.47 kg/m2  SpO2 99%     Review of Systems  Constitutional: Negative for fever, chills, appetite change and fatigue.  HENT: Negative for congestion, dental problem, ear pain, hearing loss, sore throat, tinnitus, trouble swallowing and voice change.   Eyes: Negative for pain, discharge and visual disturbance.  Respiratory: Negative for cough, chest tightness, wheezing and stridor.   Cardiovascular: Negative for chest pain, palpitations and leg swelling.  Gastrointestinal: Negative for nausea, vomiting, abdominal pain, diarrhea, constipation, blood in stool and abdominal distention.  Genitourinary: Negative for urgency, hematuria, flank pain, discharge, difficulty urinating and genital sores.  Musculoskeletal: Negative for myalgias, back pain, joint swelling, arthralgias, gait problem and neck stiffness.  Skin: Negative  for rash.  Neurological: Negative for dizziness, syncope, speech difficulty, weakness, numbness and headaches.  Hematological: Negative for adenopathy. Does not bruise/bleed easily.  Psychiatric/Behavioral: Negative for behavioral problems and dysphoric mood. The patient is not nervous/anxious.        Objective:   Physical Exam  Constitutional: He appears well-developed and well-nourished. No distress.  Genitourinary: Rectum normal. No penile tenderness.  No tenderness in the urogenital, or perineal area  Musculoskeletal:  Full range of motion both hips No tenderness over the left ischial tuberosity          Assessment & Plan:   Chronic perineal and left pelvic pain.  Unclear etiology.  Options discussed These include continued physical therapy with stretching and range of motion along with anti-inflammatory medication If unimproved, will be considered for an abdominal pelvic CT or possibly lumbar MRI

## 2015-06-01 NOTE — Progress Notes (Signed)
Pre visit review using our clinic review tool, if applicable. No additional management support is needed unless otherwise documented below in the visit note. 

## 2015-06-01 NOTE — Patient Instructions (Signed)
Call or return to clinic prn if these symptoms worsen or fail to improve as anticipated.  Take 400-600 mg of ibuprofen ( Advil, Motrin) with food every  8 hours as needed for pain relief

## 2015-06-05 ENCOUNTER — Encounter: Payer: Self-pay | Admitting: Internal Medicine

## 2015-06-06 ENCOUNTER — Other Ambulatory Visit: Payer: Self-pay | Admitting: Internal Medicine

## 2015-06-06 DIAGNOSIS — M25552 Pain in left hip: Secondary | ICD-10-CM

## 2015-06-09 ENCOUNTER — Ambulatory Visit
Admission: RE | Admit: 2015-06-09 | Discharge: 2015-06-09 | Disposition: A | Discharge status: Home / Self Care | Payer: 59 | Source: Ambulatory Visit | Attending: Internal Medicine | Admitting: Internal Medicine

## 2015-06-09 DIAGNOSIS — M25552 Pain in left hip: Secondary | ICD-10-CM

## 2015-06-09 IMAGING — CT CT ABD-PELV W/ CM
1 of 3 series · 13 of 32 positions shown, 18 images · IV contrast (APPLIED)
Comparison: None.

CLINICAL DATA: Chronic pelvic pain with pain localized to the left
buttock and left groin.

EXAM:
CT ABDOMEN AND PELVIS WITH CONTRAST
TECHNIQUE: Multidetector CT imaging of the abdomen and pelvis was performed
using the standard protocol following bolus administration of
intravenous contrast.
Creatinine was obtained on site at [HOSPITAL] at [HOSPITAL].
Results: Creatinine 0.9 mg/dL.  BUN 16.  GFR 90.
CONTRAST:  100mL [Y2] IOPAMIDOL ([Y2]) INJECTION 61%

[Series 2: abd/pelvis w/cm · axial · 0.79mm/px · z∈[-491,-36]mm · 13 of 103 slices shown, 18 images]
[im 6/103  soft-tissue]
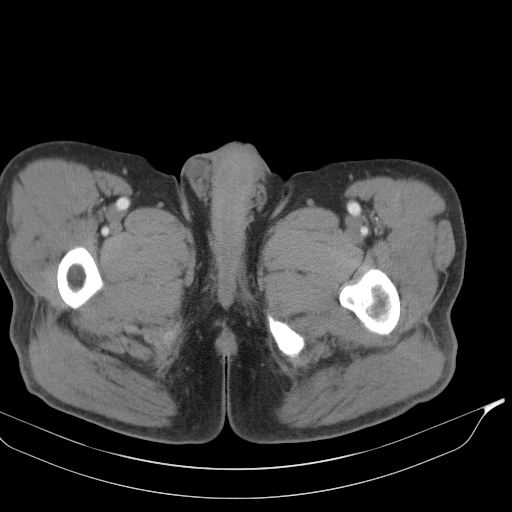
[im 6/103  bone]
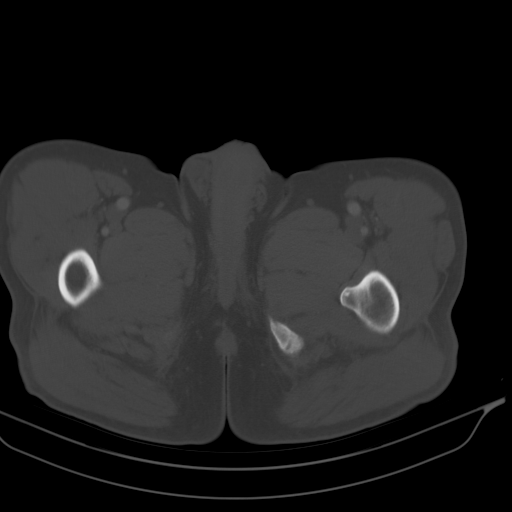
[im 17/103  soft-tissue]
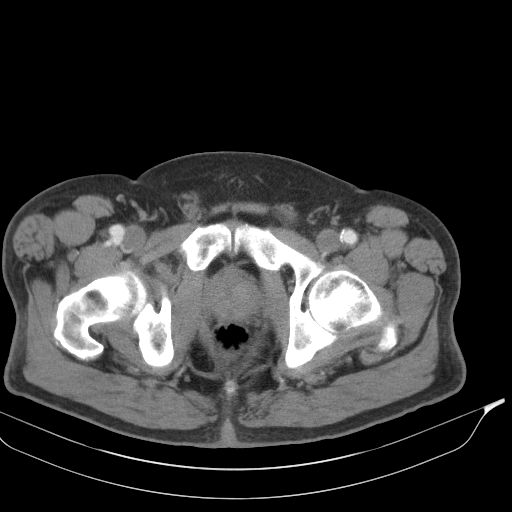
[im 22/103  soft-tissue]
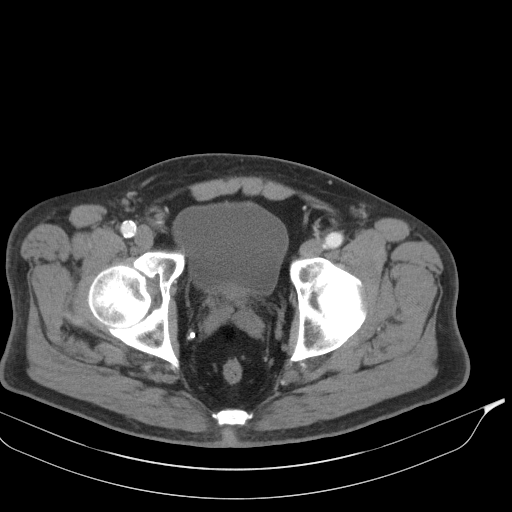
[im 33/103  soft-tissue]
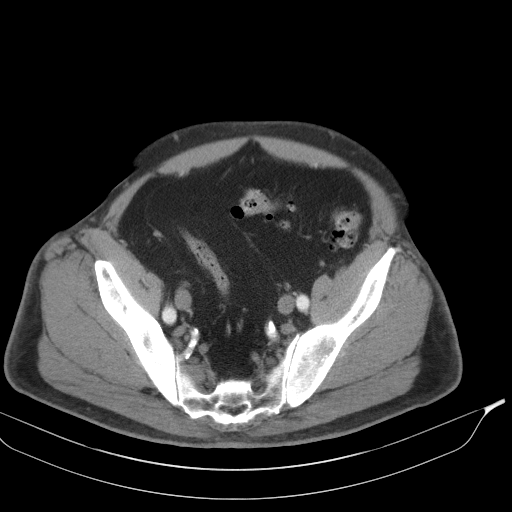
[im 38/103  soft-tissue]
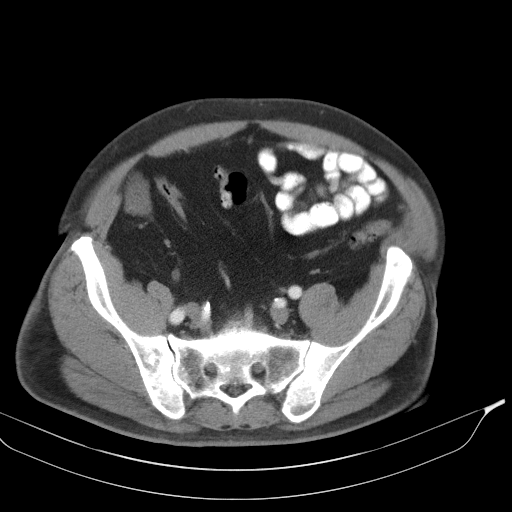
[im 49/103  soft-tissue]
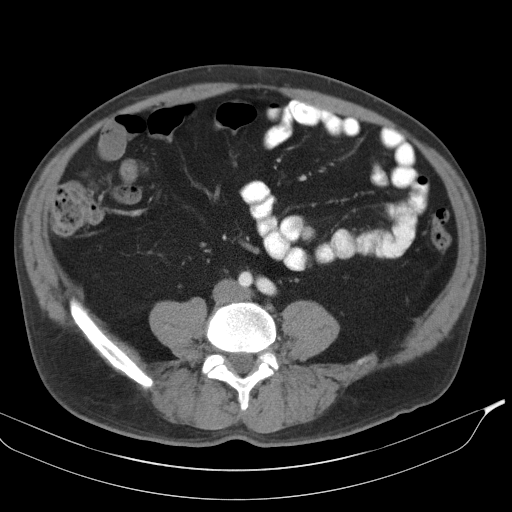
[im 54/103  soft-tissue]
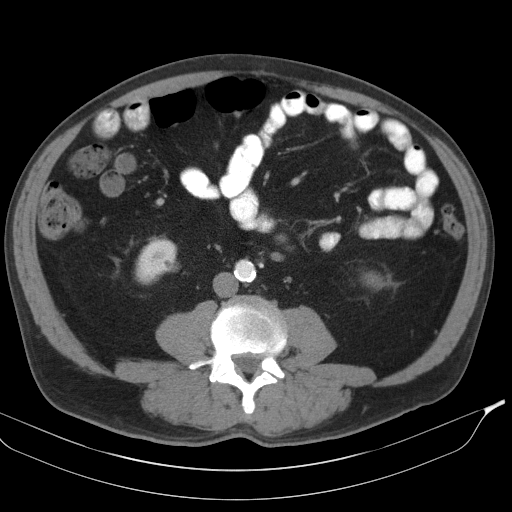
[im 65/103  soft-tissue]
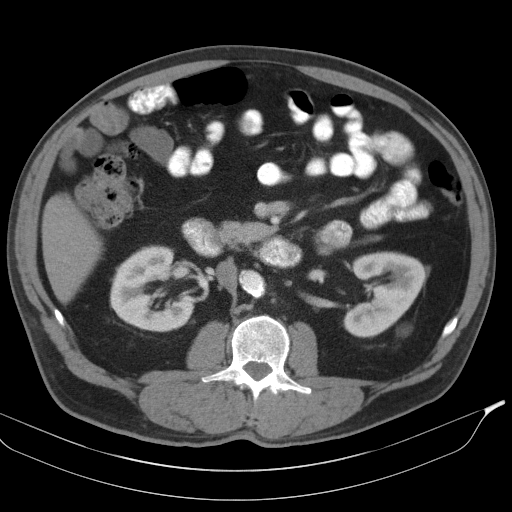
[im 70/103  soft-tissue]
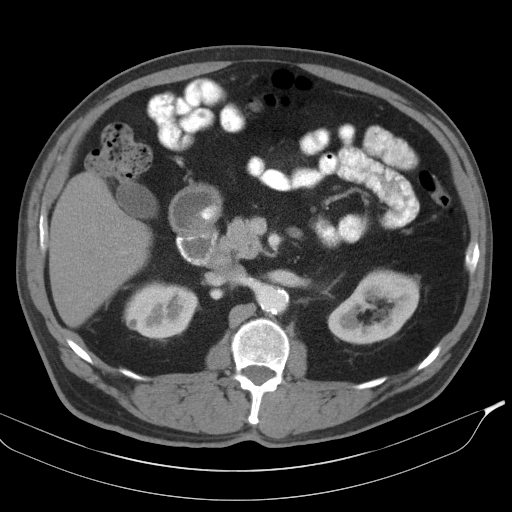
[im 70/103  bone]
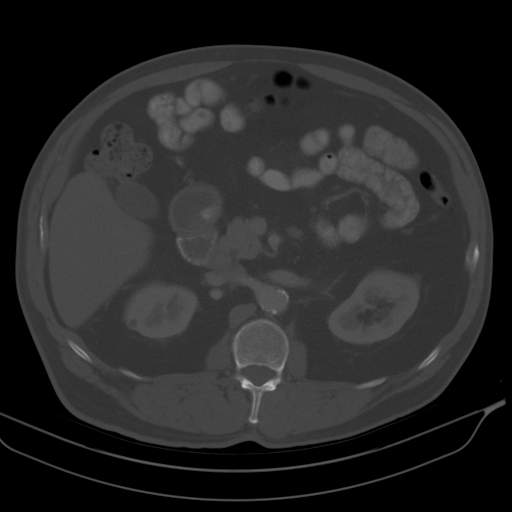
[im 81/103  soft-tissue]
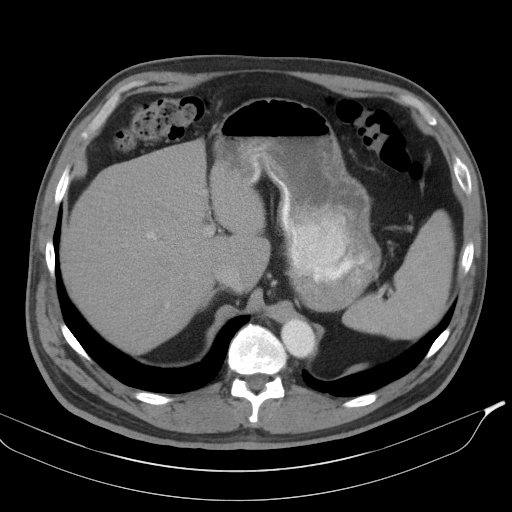
[im 81/103  lung]
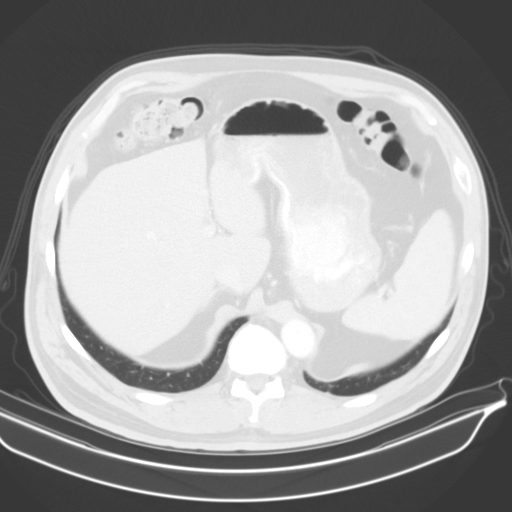
[im 86/103  soft-tissue]
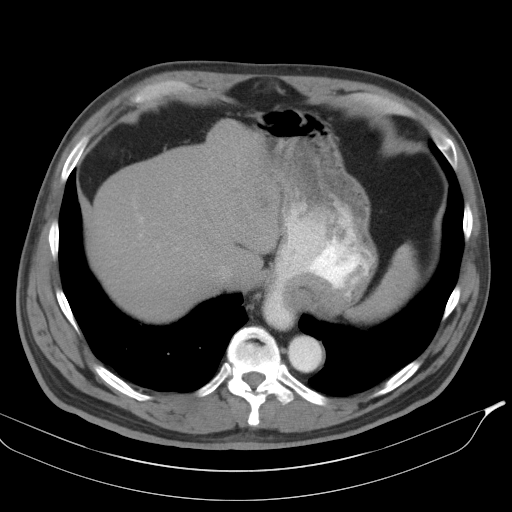
[im 86/103  lung]
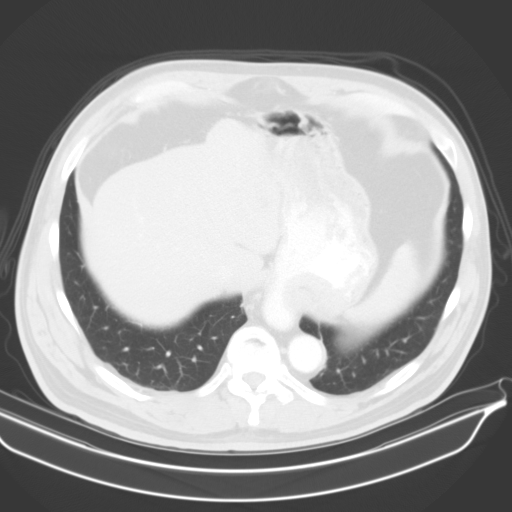
[im 92/103  lung]
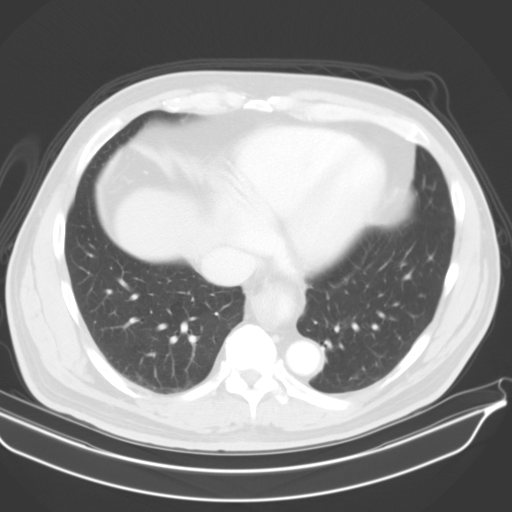
[im 97/103  soft-tissue]
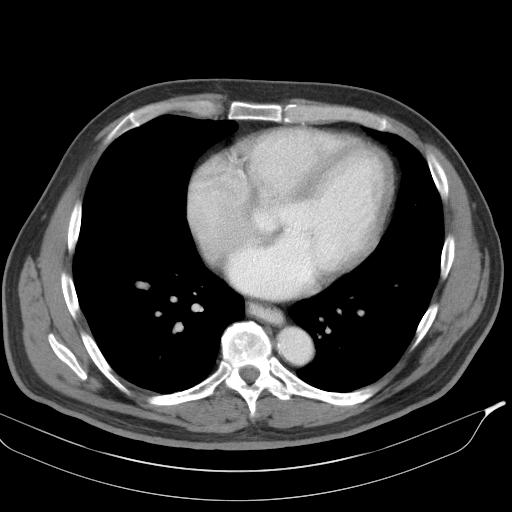
[im 97/103  lung]
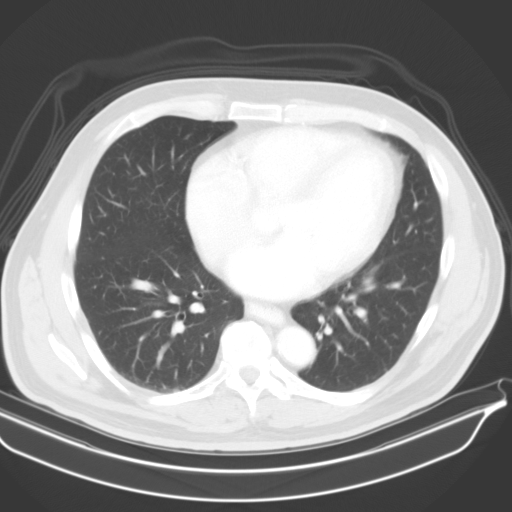

[13 of 32 positions shown; findings below may reference images not displayed]

FINDINGS: Lung bases:  Clear.  Heart mildly enlarged.

Liver: 2.5 cm low-density nonenhancing mass at the dome of the
lateral segment of the left lobe consistent with a cyst. No other
liver masses or lesions. Liver otherwise unremarkable.

Spleen, gallbladder, pancreas, adrenal glands:  Normal.

Kidneys, ureters, bladder: Small bilateral low-density nonenhancing
renal lesions, largest protruding from the lateral margin of the
midpole the left kidney measuring 2.3 cm, all consistent with cysts.
No other renal masses, no stones and no hydronephrosis. Normal
ureters. Bladder is unremarkable.

Lymph nodes:  No adenopathy.

Ascites:  None.

Gastrointestinal: Small hiatal hernia. Small bowel is unremarkable.
There are scattered colonic diverticula mostly along the left colon.
No diverticulitis. Colon otherwise unremarkable. No appendix
visualized. No CT evidence of appendicitis.

Abdominal wall: Unremarkable.

Pelvic soft tissues: No evidence of inflammation or of an abscess.
Normal appearance of the gluteal muscles.

Musculoskeletal: Disc degenerative changes most evident in the mid
and lower lumbar spine. No osteoblastic or osteolytic lesions.

Vascular: Mild atherosclerotic calcifications along the abdominal
aorta and its branch vessels. Small fusiform aneurysm of the celiac
axis measuring 16 mm in diameter. No other aneurysm.
IMPRESSION: 1. No acute findings. Cause of this patient's symptoms are not
identified on this study.
2. Colonic diverticula without diverticulitis.
3. Small hiatal hernia.  Mild cardiomegaly.
4. Small liver cyst.  Small bilateral renal cysts.
5. Small fusiform aneurysm of the celiac axis measuring 16 mm in
diameter. No aortic aneurysm.
6. Degenerative changes of the spine. This is most prominent at
L5-S1.

## 2015-06-09 MED ORDER — IOPAMIDOL (ISOVUE-300) INJECTION 61%
100.0000 mL | Freq: Once | INTRAVENOUS | Status: AC | PRN
  Administered 2015-06-09: 100 mL via INTRAVENOUS

## 2015-06-13 NOTE — Telephone Encounter (Signed)
Please see message and pt wanted CT results.

## 2015-07-04 ENCOUNTER — Encounter: Payer: Self-pay | Admitting: Internal Medicine

## 2015-07-04 ENCOUNTER — Ambulatory Visit (INDEPENDENT_AMBULATORY_CARE_PROVIDER_SITE_OTHER): Payer: 59 | Admitting: Internal Medicine

## 2015-07-04 VITALS — BP 110/70 | HR 61 | Temp 98.4°F | Resp 20 | Ht 69.75 in | Wt 198.0 lb

## 2015-07-04 DIAGNOSIS — M25552 Pain in left hip: Secondary | ICD-10-CM

## 2015-07-04 DIAGNOSIS — I10 Essential (primary) hypertension: Secondary | ICD-10-CM | POA: Diagnosis not present

## 2015-07-04 NOTE — Progress Notes (Signed)
Pre visit review using our clinic review tool, if applicable. No additional management support is needed unless otherwise documented below in the visit note. 

## 2015-07-04 NOTE — Patient Instructions (Signed)
Call or return to clinic prn if these symptoms worsen or fail to improve as anticipated.  Limit your sodium (Salt) intake  Please check your blood pressure on a regular basis.  If it is consistently greater than 150/90, please make an office appointment.  Return in 6 months for follow-up  

## 2015-07-04 NOTE — Progress Notes (Signed)
Subjective:    Patient ID: Steven [name], male    DOB: 10-May-1950, 65 y.o.   MRN: [id number]  HPI 65 year old patient who is seen today in follow-up.  He continues to have a left-sided posterior and upper inner thigh pain and perineal discomfort.  Pain is much less severe. He did have an abdominal/pelvic CT scan that was fairly unremarkable, but did reveal some thoracic and lumbar osteoarthritic changes. He has essential hypertension Pain seems improved with ibuprofen.  Past Medical History  Diagnosis Date  . Hyperlipidemia   . Hypertension   . Situational stress   . GERD with stricture     Social History   Social History  . Marital Status: Single    Spouse Name: N/A  . Number of Children: N/A  . Years of Education: N/A   Occupational History  . Not on file.   Social History Main Topics  . Smoking status: Never Smoker   . Smokeless tobacco: Never Used  . Alcohol Use: Yes  . Drug Use: No  . Sexual Activity: Not on file   Other Topics Concern  . Not on file   Social History Narrative    Past Surgical History  Procedure Laterality Date  . Cardiolite stresstest  fall 2010  . Colonoscopy  fall 2010  . Upper gastrointestinal endoscopy  fall 2010    Family History  Problem Relation Age of Onset  . Cancer Mother     lung   . Lymphoma Father   . Coronary artery disease Sister     Allergies  Allergen Reactions  . Penicillins     unknown    Current Outpatient Prescriptions on File Prior to Visit  Medication Sig Dispense Refill  . ibuprofen (ADVIL,MOTRIN) 800 MG tablet TAKE 1 TABLET BY MOUTH EVERY 8 HOURS AS NEEDED 30 tablet 2  . lisinopril (PRINIVIL,ZESTRIL) 20 MG tablet TAKE 1 TABLET BY MOUTH EVERY DAY 90 tablet 1  . Multiple Vitamin (MULTIVITAMIN) tablet Take 1 tablet by mouth daily.    . niacin (NIASPAN) 1000 MG CR tablet TAKE 1 TABLET BY MOUTH AT BEDTIME 90 tablet 3  . omeprazole (PRILOSEC) 40 MG capsule TAKE 1 CAPSULE BY MOUTH EVERY DAY 90 capsule 3    . sertraline (ZOLOFT) 50 MG tablet TAKE 1 TABLET BY MOUTH DAILY 90 tablet 3  . simvastatin (ZOCOR) 40 MG tablet TAKE 1 TABLET BY MOUTH EVERY EVENING 90 tablet 1  . testosterone cypionate (DEPOTESTOSTERONE CYPIONATE) 200 MG/ML injection Inject 1.5 mLs (300 mg total) into the muscle every 14 (fourteen) days. 10 mL 1  . triazolam (HALCION) 0.25 MG tablet Take 1 tablet (0.25 mg total) by mouth at bedtime as needed. 30 tablet 5   No current facility-administered medications on file prior to visit.    BP 110/70 mmHg  Pulse 61  Temp(Src) 98.4 F (36.9 C) (Oral)  Resp 20  Ht 5' 9.75" (1.772 m)  Wt 198 lb (89.812 kg)  BMI 28.60 kg/m2  SpO2 99%      Review of Systems  Constitutional: Negative for fever, chills, appetite change and fatigue.  HENT: Negative for congestion, dental problem, ear pain, hearing loss, sore throat, tinnitus, trouble swallowing and voice change.   Eyes: Negative for pain, discharge and visual disturbance.  Respiratory: Negative for cough, chest tightness, wheezing and stridor.   Cardiovascular: Negative for chest pain, palpitations and leg swelling.  Gastrointestinal: Negative for nausea, vomiting, abdominal pain, diarrhea, constipation, blood in stool and abdominal distention.  Genitourinary: Negative  for urgency, hematuria, flank pain, discharge, difficulty urinating and genital sores.  Musculoskeletal: Negative for myalgias, back pain, joint swelling, arthralgias, gait problem and neck stiffness.  Skin: Negative for rash.  Neurological: Negative for dizziness, syncope, speech difficulty, weakness, numbness and headaches.  Hematological: Negative for adenopathy. Does not bruise/bleed easily.  Psychiatric/Behavioral: Negative for behavioral problems and dysphoric mood. The patient is not nervous/anxious.        Objective:   Physical Exam  Constitutional: He appears well-developed and well-nourished. No distress.  Blood pressure low normal  Musculoskeletal:   Full range of motion both hips Able to walk on toes and heels without difficulty Patellar and Achilles reflexes brisk and equal          Assessment & Plan:   Left-sided perineal and thigh discomfort.  Unclear etiology but suspect related to lumbar disc disease and nerve root irritation.  Pain is improved.  Patient wishes to follow clinically and to consider lumbar MRI only if symptoms worsen Hypertension, stable

## 2015-08-09 ENCOUNTER — Other Ambulatory Visit: Payer: Self-pay | Admitting: Internal Medicine

## 2015-08-10 ENCOUNTER — Other Ambulatory Visit: Payer: Self-pay | Admitting: Internal Medicine

## 2015-09-29 ENCOUNTER — Encounter: Payer: Self-pay | Admitting: Internal Medicine

## 2015-09-29 NOTE — Telephone Encounter (Signed)
Please see message and advise 

## 2015-09-30 ENCOUNTER — Other Ambulatory Visit: Payer: Self-pay | Admitting: Internal Medicine

## 2015-09-30 DIAGNOSIS — E349 Endocrine disorder, unspecified: Secondary | ICD-10-CM

## 2015-10-06 ENCOUNTER — Other Ambulatory Visit (INDEPENDENT_AMBULATORY_CARE_PROVIDER_SITE_OTHER): Payer: 59

## 2015-10-06 DIAGNOSIS — E291 Testicular hypofunction: Secondary | ICD-10-CM

## 2015-10-06 DIAGNOSIS — E349 Endocrine disorder, unspecified: Secondary | ICD-10-CM

## 2015-10-13 ENCOUNTER — Encounter: Payer: Self-pay | Admitting: Internal Medicine

## 2015-10-24 ENCOUNTER — Other Ambulatory Visit: Payer: Self-pay | Admitting: Internal Medicine

## 2015-11-12 ENCOUNTER — Other Ambulatory Visit: Payer: Self-pay | Admitting: Internal Medicine

## 2015-11-15 NOTE — Telephone Encounter (Signed)
Rx printed for signature. 

## 2015-11-15 NOTE — Telephone Encounter (Signed)
Okay to refill? 

## 2015-11-24 ENCOUNTER — Other Ambulatory Visit: Payer: Self-pay | Admitting: Internal Medicine

## 2015-11-24 NOTE — Telephone Encounter (Signed)
Rx refill sent to pharmacy. 

## 2015-11-26 ENCOUNTER — Encounter: Payer: Self-pay | Admitting: Internal Medicine

## 2015-12-03 ENCOUNTER — Encounter: Payer: Self-pay | Admitting: Internal Medicine

## 2015-12-05 ENCOUNTER — Other Ambulatory Visit: Payer: Self-pay | Admitting: Internal Medicine

## 2015-12-06 ENCOUNTER — Encounter: Payer: Self-pay | Admitting: Internal Medicine

## 2015-12-16 ENCOUNTER — Other Ambulatory Visit: Payer: Self-pay | Admitting: Internal Medicine

## 2015-12-21 ENCOUNTER — Other Ambulatory Visit: Payer: Self-pay | Admitting: Internal Medicine

## 2015-12-21 NOTE — Telephone Encounter (Signed)
Rx refill sent to pharmacy. 

## 2016-01-02 ENCOUNTER — Other Ambulatory Visit: Payer: Self-pay | Admitting: Internal Medicine

## 2016-01-12 ENCOUNTER — Other Ambulatory Visit: Payer: Self-pay | Admitting: Internal Medicine

## 2016-01-13 ENCOUNTER — Other Ambulatory Visit: Payer: Self-pay | Admitting: Internal Medicine

## 2016-01-31 ENCOUNTER — Ambulatory Visit (INDEPENDENT_AMBULATORY_CARE_PROVIDER_SITE_OTHER): Payer: 59 | Admitting: Family Medicine

## 2016-01-31 ENCOUNTER — Encounter: Payer: Self-pay | Admitting: Family Medicine

## 2016-01-31 VITALS — BP 122/81 | HR 50 | Temp 97.9°F | Ht 69.75 in | Wt 193.0 lb

## 2016-01-31 DIAGNOSIS — Z23 Encounter for immunization: Secondary | ICD-10-CM | POA: Diagnosis not present

## 2016-01-31 DIAGNOSIS — R197 Diarrhea, unspecified: Secondary | ICD-10-CM

## 2016-01-31 MED ORDER — DIPHENOXYLATE-ATROPINE 2.5-0.025 MG PO TABS: 2.0000 | ORAL_TABLET | Freq: Four times a day (QID) | ORAL | 2 refills | Status: DC | PRN

## 2016-01-31 NOTE — Progress Notes (Signed)
   Subjective:    Patient ID: Steven [name], male    DOB: 05/27/1950, 65 y.o.   MRN: [id number]  HPI Here for 2 weeks of loose stools and diarrhea. He denies any cramps or pain, and no real urgency. He just feels to need to defecate frequently and he passes 4 loose stools every day. No fever or weight loss. No nausea or vomiting. Appetite is normal. No recent travel other than to the beach a few weeks ago.  No recent medication changes. He had a normal colonoscopy a few years ago. He has tried Kaopectate and Imodium with mixed results.   Review of Systems  Constitutional: Negative.   Respiratory: Negative.   Cardiovascular: Negative.   Gastrointestinal: Positive for diarrhea. Negative for abdominal distention, abdominal pain, anal bleeding, blood in stool, constipation, nausea, rectal pain and vomiting.  Genitourinary: Negative.        Objective:   Physical Exam  Constitutional: He appears well-developed and well-nourished. No distress.  Eyes: No scleral icterus.  Neck: No thyromegaly present.  Cardiovascular: Normal rate, regular rhythm, normal heart sounds and intact distal pulses.   Pulmonary/Chest: Effort normal and breath sounds normal.  Abdominal: Soft. Bowel sounds are normal. He exhibits no distension and no mass. There is no tenderness. There is no rebound and no guarding.  Lymphadenopathy:    He has no cervical adenopathy.          Assessment & Plan:  Diarrhea of uncertain etiology. Try Lomotil prn. Refer to GI.  Nelwyn SalisburyFRY,STEPHEN A, MD

## 2016-01-31 NOTE — Progress Notes (Signed)
Pre visit review using our clinic review tool, if applicable. No additional management support is needed unless otherwise documented below in the visit note. 

## 2016-01-31 NOTE — Addendum Note (Signed)
Addended by: Aniceto BossNIMMONS, Smaran Gaus A on: 01/31/2016 04:45 PM   Modules accepted: Orders

## 2016-02-21 ENCOUNTER — Other Ambulatory Visit: Payer: Self-pay | Admitting: Internal Medicine

## 2016-03-03 ENCOUNTER — Other Ambulatory Visit: Payer: Self-pay | Admitting: Internal Medicine

## 2016-03-12 ENCOUNTER — Other Ambulatory Visit: Payer: Self-pay | Admitting: Internal Medicine

## 2016-03-21 ENCOUNTER — Encounter: Payer: Self-pay | Admitting: Internal Medicine

## 2016-03-31 ENCOUNTER — Other Ambulatory Visit: Payer: Self-pay | Admitting: Internal Medicine

## 2016-04-06 ENCOUNTER — Encounter: Payer: Self-pay | Admitting: Internal Medicine

## 2016-04-06 LAB — HM COLONOSCOPY

## 2016-04-10 ENCOUNTER — Encounter: Payer: Self-pay | Admitting: Internal Medicine

## 2016-05-01 ENCOUNTER — Other Ambulatory Visit: Payer: Self-pay | Admitting: Internal Medicine

## 2016-05-30 ENCOUNTER — Encounter: Payer: Self-pay | Admitting: Internal Medicine

## 2016-05-31 ENCOUNTER — Other Ambulatory Visit: Payer: Self-pay | Admitting: Internal Medicine

## 2016-06-01 ENCOUNTER — Other Ambulatory Visit: Payer: 59

## 2016-06-04 ENCOUNTER — Telehealth: Payer: Self-pay | Admitting: Internal Medicine

## 2016-06-04 ENCOUNTER — Other Ambulatory Visit: Payer: Self-pay | Admitting: Internal Medicine

## 2016-06-04 ENCOUNTER — Other Ambulatory Visit (INDEPENDENT_AMBULATORY_CARE_PROVIDER_SITE_OTHER): Payer: Medicare Other

## 2016-06-04 DIAGNOSIS — Z0000 Encounter for general adult medical examination without abnormal findings: Secondary | ICD-10-CM | POA: Diagnosis not present

## 2016-06-04 LAB — BASIC METABOLIC PANEL
BUN: 12 mg/dL (ref 6–23)
CALCIUM: 9.3 mg/dL (ref 8.4–10.5)
CHLORIDE: 103 meq/L (ref 96–112)
CO2: 28 mEq/L (ref 19–32)
CREATININE: 0.91 mg/dL (ref 0.40–1.50)
GFR: 88.87 mL/min (ref >60.00)
Glucose, Bld: 112 mg/dL — ABNORMAL HIGH (ref 70–99)
Potassium: 4.3 mEq/L (ref 3.5–5.1)
Sodium: 139 mEq/L (ref 135–145)

## 2016-06-04 LAB — HEPATIC FUNCTION PANEL
ALBUMIN: 4.4 g/dL (ref 3.5–5.2)
ALK PHOS: 50 U/L (ref 39–117)
ALT: 20 U/L (ref 0–53)
AST: 20 U/L (ref 0–37)
BILIRUBIN DIRECT: 0.1 mg/dL (ref 0.0–0.3)
Total Bilirubin: 0.5 mg/dL (ref 0.2–1.2)
Total Protein: 6.8 g/dL (ref 6.0–8.3)

## 2016-06-04 LAB — CBC WITH DIFFERENTIAL/PLATELET
BASOS ABS: 0 10*3/uL (ref 0.0–0.1)
BASOS PCT: 0.6 % (ref 0.0–3.0)
EOS ABS: 0 10*3/uL (ref 0.0–0.7)
Eosinophils Relative: 0.8 % (ref 0.0–5.0)
HEMATOCRIT: 49.1 % (ref 39.0–52.0)
HEMOGLOBIN: 16.5 g/dL (ref 13.0–17.0)
LYMPHS PCT: 29.2 % (ref 12.0–46.0)
Lymphs Abs: 1.7 10*3/uL (ref 0.7–4.0)
MCHC: 33.5 g/dL (ref 30.0–36.0)
MCV: 91.6 fl (ref 78.0–100.0)
MONO ABS: 0.8 10*3/uL (ref 0.1–1.0)
Monocytes Relative: 13.7 % — ABNORMAL HIGH (ref 3.0–12.0)
Neutro Abs: 3.3 10*3/uL (ref 1.4–7.7)
Neutrophils Relative %: 55.7 % (ref 43.0–77.0)
Platelets: 215 10*3/uL (ref 150.0–400.0)
RBC: 5.36 Mil/uL (ref 4.22–5.81)
RDW: 14.8 % (ref 11.5–15.5)
WBC: 5.8 10*3/uL (ref 4.0–10.5)

## 2016-06-04 LAB — POC URINALSYSI DIPSTICK (AUTOMATED)
Bilirubin, UA: NEGATIVE
Glucose, UA: NEGATIVE
Ketones, UA: NEGATIVE
Leukocytes, UA: NEGATIVE
NITRITE UA: NEGATIVE
PROTEIN UA: NEGATIVE
RBC UA: NEGATIVE
Spec Grav, UA: 1.02
UROBILINOGEN UA: 0.2
pH, UA: 6.5

## 2016-06-04 LAB — TSH: TSH: 1.56 u[IU]/mL (ref 0.35–4.50)

## 2016-06-04 LAB — LIPID PANEL
CHOLESTEROL: 185 mg/dL (ref 0–200)
HDL: 60.1 mg/dL (ref >39.00)
LDL CALC: 109 mg/dL — AB (ref 0–99)
NonHDL: 125.02
TRIGLYCERIDES: 78 mg/dL (ref 0.0–149.0)
Total CHOL/HDL Ratio: 3
VLDL: 15.6 mg/dL (ref 0.0–40.0)

## 2016-06-04 NOTE — Telephone Encounter (Signed)
Pt needs short term 30 day [facility] med[id number] [place], [place] - [address]  Then, for long term 90 day,pt has turned 66 and needs all meds sent to   [facility]

## 2016-06-05 ENCOUNTER — Other Ambulatory Visit: Payer: Self-pay | Admitting: Internal Medicine

## 2016-06-05 MED ORDER — NIACIN ER (ANTIHYPERLIPIDEMIC) 1000 MG PO TBCR: 1000.0000 mg | EXTENDED_RELEASE_TABLET | Freq: Every day | ORAL | 2 refills | Status: DC

## 2016-06-05 MED ORDER — OMEPRAZOLE 40 MG PO CPDR: DELAYED_RELEASE_CAPSULE | ORAL | 0 refills | Status: DC

## 2016-06-05 MED ORDER — SERTRALINE HCL 50 MG PO TABS: 50.0000 mg | ORAL_TABLET | Freq: Every day | ORAL | 2 refills | Status: DC

## 2016-06-05 MED ORDER — NIACIN ER (ANTIHYPERLIPIDEMIC) 1000 MG PO TBCR: 1000.0000 mg | EXTENDED_RELEASE_TABLET | Freq: Every day | ORAL | 0 refills | Status: DC

## 2016-06-05 MED ORDER — OMEPRAZOLE 40 MG PO CPDR: DELAYED_RELEASE_CAPSULE | ORAL | 2 refills | Status: DC

## 2016-06-05 MED ORDER — SIMVASTATIN 40 MG PO TABS: 40.0000 mg | ORAL_TABLET | Freq: Every evening | ORAL | 0 refills | Status: DC

## 2016-06-05 MED ORDER — LISINOPRIL 20 MG PO TABS: 20.0000 mg | ORAL_TABLET | Freq: Every day | ORAL | 2 refills | Status: DC

## 2016-06-05 MED ORDER — LISINOPRIL 20 MG PO TABS: 20.0000 mg | ORAL_TABLET | Freq: Every day | ORAL | 0 refills | Status: DC

## 2016-06-05 MED ORDER — SERTRALINE HCL 50 MG PO TABS: 50.0000 mg | ORAL_TABLET | Freq: Every day | ORAL | 0 refills | Status: DC

## 2016-06-05 MED ORDER — SIMVASTATIN 40 MG PO TABS: 40.0000 mg | ORAL_TABLET | Freq: Every evening | ORAL | 2 refills | Status: DC

## 2016-06-05 NOTE — Telephone Encounter (Signed)
Completed.

## 2016-06-07 ENCOUNTER — Other Ambulatory Visit: Payer: Self-pay | Admitting: Internal Medicine

## 2016-06-07 MED ORDER — TESTOSTERONE CYPIONATE 200 MG/ML IM SOLN: INTRAMUSCULAR | 0 refills | Status: DC

## 2016-06-07 MED ORDER — TRIAZOLAM 0.25 MG PO TABS: 0.2500 mg | ORAL_TABLET | Freq: Every evening | ORAL | 1 refills | Status: DC | PRN

## 2016-06-08 ENCOUNTER — Encounter: Payer: Self-pay | Admitting: Internal Medicine

## 2016-06-08 ENCOUNTER — Ambulatory Visit (INDEPENDENT_AMBULATORY_CARE_PROVIDER_SITE_OTHER): Payer: Medicare Other | Admitting: Internal Medicine

## 2016-06-08 VITALS — BP 122/70 | HR 60 | Temp 97.8°F | Ht 69.75 in | Wt 196.2 lb

## 2016-06-08 DIAGNOSIS — E349 Endocrine disorder, unspecified: Secondary | ICD-10-CM

## 2016-06-08 DIAGNOSIS — I10 Essential (primary) hypertension: Secondary | ICD-10-CM

## 2016-06-08 DIAGNOSIS — Z23 Encounter for immunization: Secondary | ICD-10-CM | POA: Diagnosis not present

## 2016-06-08 DIAGNOSIS — I728 Aneurysm of other specified arteries: Secondary | ICD-10-CM | POA: Diagnosis not present

## 2016-06-08 DIAGNOSIS — E7801 Familial hypercholesterolemia: Secondary | ICD-10-CM | POA: Diagnosis not present

## 2016-06-08 DIAGNOSIS — R7302 Impaired glucose tolerance (oral): Secondary | ICD-10-CM | POA: Diagnosis not present

## 2016-06-08 NOTE — Patient Instructions (Addendum)
Limit your sodium (Salt) intake  Please check your blood pressure on a regular basis.  If it is consistently greater than 150/90, please make an office appointment.  Consider taper and discontinuation of omeprazole in substitution of Pepcid    It is important that you exercise regularly, at least 20 minutes 3 to 4 times per week.  If you develop chest pain or shortness of breath seek  medical attention.    Health Maintenance, Male A healthy lifestyle and preventative care can promote health and wellness.  Maintain regular health, dental, and eye exams.  Eat a healthy diet. Foods like vegetables, fruits, whole grains, low-fat dairy products, and lean protein foods contain the nutrients you need and are low in calories. Decrease your intake of foods high in solid fats, added sugars, and salt. Get information about a proper diet from your health care provider, if necessary.  Regular physical exercise is one of the most important things you can do for your health. Most adults should get at least 150 minutes of moderate-intensity exercise (any activity that increases your heart rate and causes you to sweat) each week. In addition, most adults need muscle-strengthening exercises on 2 or more days a week.   Maintain a healthy weight. The body mass index (BMI) is a screening tool to identify possible weight problems. It provides an estimate of body fat based on height and weight. Your health care provider can find your BMI and can help you achieve or maintain a healthy weight. For males 20 years and older:  A BMI below 18.5 is considered underweight.  A BMI of 18.5 to 24.9 is normal.  A BMI of 25 to 29.9 is considered overweight.  A BMI of 30 and above is considered obese.  Maintain normal blood lipids and cholesterol by exercising and minimizing your intake of saturated fat. Eat a balanced diet with plenty of fruits and vegetables. Blood tests for lipids and cholesterol should begin at age 65  and be repeated every 5 years. If your lipid or cholesterol levels are high, you are over age 65, or you are at high risk for heart disease, you may need your cholesterol levels checked more frequently.Ongoing high lipid and cholesterol levels should be treated with medicines if diet and exercise are not working.  If you smoke, find out from your health care provider how to quit. If you do not use tobacco, do not start.  Lung cancer screening is recommended for adults aged 55-80 years who are at high risk for developing lung cancer because of a history of smoking. A yearly low-dose CT scan of the lungs is recommended for people who have at least a 30-pack-year history of smoking and are current smokers or have quit within the past 15 years. A pack year of smoking is smoking an average of 1 pack of cigarettes a day for 1 year (for example, a 30-pack-year history of smoking could mean smoking 1 pack a day for 30 years or 2 packs a day for 15 years). Yearly screening should continue until the smoker has stopped smoking for at least 15 years. Yearly screening should be stopped for people who develop a health problem that would prevent them from having lung cancer treatment.  If you choose to drink alcohol, do not have more than 2 drinks per day. One drink is considered to be 12 oz (360 mL) of beer, 5 oz (150 mL) of wine, or 1.5 oz (45 mL) of liquor.  Avoid the use  of street drugs. Do not share needles with anyone. Ask for help if you need support or instructions about stopping the use of drugs.  High blood pressure causes heart disease and increases the risk of stroke. High blood pressure is more likely to develop in:  People who have blood pressure in the end of the normal range (100-139/85-89 mm Hg).  People who are overweight or obese.  People who are African American.  If you are 11-46 years of age, have your blood pressure checked every 3-5 years. If you are 5 years of age or older, have your  blood pressure checked every year. You should have your blood pressure measured twice-once when you are at a hospital or clinic, and once when you are not at a hospital or clinic. Record the average of the two measurements. To check your blood pressure when you are not at a hospital or clinic, you can use:  An automated blood pressure machine at a pharmacy.  A home blood pressure monitor.  If you are 57-84 years old, ask your health care provider if you should take aspirin to prevent heart disease.  Diabetes screening involves taking a blood sample to check your fasting blood sugar level. This should be done once every 3 years after age 20 if you are at a normal weight and without risk factors for diabetes. Testing should be considered at a younger age or be carried out more frequently if you are overweight and have at least 1 risk factor for diabetes.  Colorectal cancer can be detected and often prevented. Most routine colorectal cancer screening begins at the age of 57 and continues through age 42. However, your health care provider may recommend screening at an earlier age if you have risk factors for colon cancer. On a yearly basis, your health care provider may provide home test kits to check for hidden blood in the stool. A small camera at the end of a tube may be used to directly examine the colon (sigmoidoscopy or colonoscopy) to detect the earliest forms of colorectal cancer. Talk to your health care provider about this at age 88 when routine screening begins. A direct exam of the colon should be repeated every 5-10 years through age 45, unless early forms of precancerous polyps or small growths are found.  People who are at an increased risk for hepatitis B should be screened for this virus. You are considered at high risk for hepatitis B if:  You were born in a country where hepatitis B occurs often. Talk with your health care provider about which countries are considered high risk.  Your  parents were born in a high-risk country and you have not received a shot to protect against hepatitis B (hepatitis B vaccine).  You have HIV or AIDS.  You use needles to inject street drugs.  You live with, or have sex with, someone who has hepatitis B.  You are a man who has sex with other men (MSM).  You get hemodialysis treatment.  You take certain medicines for conditions like cancer, organ transplantation, and autoimmune conditions.  Hepatitis C blood testing is recommended for all people born from [birth year] through 1964 and any individual with known risk factors for hepatitis C.  Healthy men should no longer receive prostate-specific antigen (PSA) blood tests as part of routine cancer screening. Talk to your health care provider about prostate cancer screening.  Testicular cancer screening is not recommended for adolescents or adult males who have no symptoms.  Screening includes self-exam, a health care provider exam, and other screening tests. Consult with your health care provider about any symptoms you have or any concerns you have about testicular cancer.  Practice safe sex. Use condoms and avoid high-risk sexual practices to reduce the spread of sexually transmitted infections (STIs).  You should be screened for STIs, including gonorrhea and chlamydia if:  You are sexually active and are younger than 24 years.  You are older than 24 years, and your health care provider tells you that you are at risk for this type of infection.  Your sexual activity has changed since you were last screened, and you are at an increased risk for chlamydia or gonorrhea. Ask your health care provider if you are at risk.  If you are at risk of being infected with HIV, it is recommended that you take a prescription medicine daily to prevent HIV infection. This is called pre-exposure prophylaxis (PrEP). You are considered at risk if:  You are a man who has sex with other men (MSM).  You are a  heterosexual man who is sexually active with multiple partners.  You take drugs by injection.  You are sexually active with a partner who has HIV.  Talk with your health care provider about whether you are at high risk of being infected with HIV. If you choose to begin PrEP, you should first be tested for HIV. You should then be tested every 3 months for as long as you are taking PrEP.  Use sunscreen. Apply sunscreen liberally and repeatedly throughout the day. You should seek shade when your shadow is shorter than you. Protect yourself by wearing long sleeves, pants, a wide-brimmed hat, and sunglasses year round whenever you are outdoors.  Tell your health care provider of new moles or changes in moles, especially if there is a change in shape or color. Also, tell your health care provider if a mole is larger than the size of a pencil eraser.  A one-time screening for abdominal aortic aneurysm (AAA) and surgical repair of large AAAs by ultrasound is recommended for men aged 65-75 years who are current or former smokers.  Stay current with your vaccines (immunizations). This information is not intended to replace advice given to you by your health care provider. Make sure you discuss any questions you have with your health care provider. Document Released: 10/13/2007 Document Revised: 05/07/2014 Document Reviewed: 01/18/2015 Elsevier Interactive Patient Education  2017 [employer].

## 2016-06-08 NOTE — Progress Notes (Signed)
Subjective:    Patient ID: Steven [name], male    DOB: 1950-09-01, 66 y.o.   MRN: [id number]  HPI 66 year old patient who is seen today for annual follow-up. 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  This continues to improve  Last spring he had some significant the peroneal and proximal leg discomfort.  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  He has hypertension, dyslipidemia and a history of mild impaired glucose tolerance.  He has gastroesophageal reflux disease and also a history of testosterone deficiency.  Remains quite active, still unemployed and exercises regularly.  He is  a Programme researcher, broadcasting/film/video  Past Medical History:  Diagnosis Date  . GERD with stricture   . Hyperlipidemia   . Hypertension   . Situational stress      Social History   Social History  . Marital status: Single    Spouse name: N/A  . Number of children: N/A  . Years of education: N/A   Occupational History  . Not on file.   Social History Main Topics  . Smoking status: Never Smoker  . Smokeless tobacco: Never Used  . Alcohol use Yes  . Drug use: No  . Sexual activity: Not on file   Other Topics Concern  . Not on file   Social History Narrative  . No narrative on file    Past Surgical History:  Procedure Laterality Date  . cardiolite stresstest  fall 2010  . COLONOSCOPY  fall 2010  . UPPER GASTROINTESTINAL ENDOSCOPY  fall 2010    Family History  Problem Relation Age of Onset  . Cancer Mother     lung   . Lymphoma Father   . Coronary artery disease Sister     Allergies  Allergen Reactions  . Penicillins     unknown    Current Outpatient Prescriptions on File Prior to Visit  Medication Sig Dispense Refill  . diphenoxylate-atropine (LOMOTIL) 2.5-0.025 MG tablet Take 2 tablets by mouth 4 (four) times daily as needed for diarrhea or loose stools. 60 tablet 2  .  ibuprofen (ADVIL,MOTRIN) 800 MG tablet TAKE 1 TABLET BY MOUTH EVERY 8 HOURS AS NEEDED 30 tablet 2  . lisinopril (PRINIVIL,ZESTRIL) 20 MG tablet Take 1 tablet (20 mg total) by mouth daily. 90 tablet 2  . Multiple Vitamin (MULTIVITAMIN) tablet Take 1 tablet by mouth daily.    . niacin (NIASPAN) 1000 MG CR tablet Take 1 tablet (1,000 mg total) by mouth at bedtime. 90 tablet 2  . omeprazole (PRILOSEC) 40 MG capsule TAKE 1 CAPSULE BY MOUTH EVERY DAY 90 capsule 2  . sertraline (ZOLOFT) 50 MG tablet Take 1 tablet (50 mg total) by mouth daily. 90 tablet 2  . simvastatin (ZOCOR) 40 MG tablet Take 1 tablet (40 mg total) by mouth every evening. 90 tablet 2  . testosterone cypionate (DEPOTESTOSTERONE CYPIONATE) 200 MG/ML injection INJECT 1.5 MLS IN THE MUSCLE EVERY 14 DAYS AS DIRECTED 10 mL 0  . triazolam (HALCION) 0.25 MG tablet Take 1 tablet (0.25 mg total) by mouth at bedtime as needed. 30 tablet 1   No current facility-administered medications on file prior to visit.     BP 122/70 (BP Location: Right Arm, Patient Position: Sitting, Cuff Size: Normal)   Pulse 60   Temp 97.8 F (36.6 C) (Oral)   Ht 5' 9.75" (1.772 m)   Wt 196 lb 3.2 oz (89 kg)  SpO2 98%   BMI 28.35 kg/m      Review of Systems  Constitutional: Negative for activity change, appetite change, chills, fatigue and fever.  HENT: Negative for congestion, dental problem, ear pain, hearing loss, mouth sores, rhinorrhea, sinus pressure, sneezing, tinnitus, trouble swallowing and voice change.   Eyes: Negative for photophobia, pain, redness and visual disturbance.  Respiratory: Negative for apnea, cough, choking, chest tightness, shortness of breath and wheezing.   Cardiovascular: Negative for chest pain, palpitations and leg swelling.  Gastrointestinal: Negative for abdominal distention, abdominal pain, anal bleeding, blood in stool, constipation, diarrhea, nausea, rectal pain and vomiting.  Genitourinary: Negative for decreased urine  volume, difficulty urinating, discharge, dysuria, flank pain, frequency, genital sores, hematuria, penile swelling, scrotal swelling, testicular pain and urgency.  Musculoskeletal: Negative for arthralgias, back pain, gait problem, joint swelling, myalgias, neck pain and neck stiffness.  Skin: Negative for color change, rash and wound.  Neurological: Negative for dizziness, tremors, seizures, syncope, facial asymmetry, speech difficulty, weakness, light-headedness, numbness and headaches.  Hematological: Negative for adenopathy. Does not bruise/bleed easily.  Psychiatric/Behavioral: Negative for agitation, behavioral problems, confusion, decreased concentration, dysphoric mood, hallucinations, self-injury, sleep disturbance and suicidal ideas. The patient is not nervous/anxious.        Objective:   Physical Exam  Constitutional: He appears well-developed and well-nourished.  HENT:  Head: Normocephalic and atraumatic.  Right Ear: External ear normal.  Left Ear: External ear normal.  Nose: Nose normal.  Mouth/Throat: Oropharynx is clear and moist.  Eyes: Conjunctivae and EOM are normal. Pupils are equal, round, and reactive to light. No scleral icterus.  Neck: Normal range of motion. Neck supple. No JVD present. No thyromegaly present.  Cardiovascular: Regular rhythm, normal heart sounds and intact distal pulses.  Exam reveals no gallop and no friction rub.   No murmur heard. Pulmonary/Chest: Effort normal and breath sounds normal. He exhibits no tenderness.  Abdominal: Soft. Bowel sounds are normal. He exhibits no distension and no mass. There is no tenderness.  Genitourinary: Penis normal.  Musculoskeletal: Normal range of motion. He exhibits no edema or tenderness.  Lymphadenopathy:    He has no cervical adenopathy.  Neurological: He is alert. He has normal reflexes. No cranial nerve deficit. Coordination normal.  Skin: Skin is warm and dry. No rash noted.  Psychiatric: He has a normal  mood and affect. His behavior is normal.          Assessment & Plan:   Hypertension, well-controlled.  No change in therapy Dyslipidemia.  Continue statin therapy.  Patient given the option of discontinuation of niacin.  He will consider Gastroesophageal reflux disease.  He has been on chronic PPI therapy and has always failed trials of discontinuation.  He will consider a trial of slow taper discontinuation in substitution with H2 blocker therapy History of celiac artery aneurysm.  Will follow-up on an imaging study later this year.  After all his deductibles have been met Microscopic colitis improved Testosterone deficiency History of insomnia.  Now, well-controlled with melatonin  Return in one year or as needed Medications updated  Nyoka Cowden

## 2016-06-08 NOTE — Progress Notes (Signed)
Pre visit review using our clinic review tool, if applicable. No additional management support is needed unless otherwise documented below in the visit note. 

## 2016-06-15 ENCOUNTER — Encounter: Payer: Self-pay | Admitting: Internal Medicine

## 2016-09-11 DIAGNOSIS — I10 Essential (primary) hypertension: Secondary | ICD-10-CM | POA: Diagnosis not present

## 2016-09-11 DIAGNOSIS — K52838 Other microscopic colitis: Secondary | ICD-10-CM | POA: Diagnosis not present

## 2016-09-12 ENCOUNTER — Encounter: Payer: Self-pay | Admitting: Family Medicine

## 2016-09-19 ENCOUNTER — Other Ambulatory Visit: Payer: Self-pay | Admitting: Internal Medicine

## 2016-09-19 ENCOUNTER — Encounter: Payer: Self-pay | Admitting: Internal Medicine

## 2016-09-20 ENCOUNTER — Other Ambulatory Visit: Payer: Self-pay | Admitting: Internal Medicine

## 2016-09-20 DIAGNOSIS — I728 Aneurysm of other specified arteries: Secondary | ICD-10-CM

## 2016-09-20 MED ORDER — TESTOSTERONE CYPIONATE 200 MG/ML IM SOLN: INTRAMUSCULAR | 0 refills | Status: DC

## 2016-09-21 ENCOUNTER — Encounter: Payer: Self-pay | Admitting: Internal Medicine

## 2016-09-21 DIAGNOSIS — I728 Aneurysm of other specified arteries: Secondary | ICD-10-CM

## 2016-09-25 ENCOUNTER — Other Ambulatory Visit: Payer: Self-pay | Admitting: Internal Medicine

## 2016-09-26 ENCOUNTER — Encounter: Payer: Self-pay | Admitting: Internal Medicine

## 2016-09-27 ENCOUNTER — Other Ambulatory Visit: Payer: Self-pay | Admitting: Internal Medicine

## 2016-09-27 MED ORDER — LISINOPRIL 20 MG PO TABS: 20.0000 mg | ORAL_TABLET | Freq: Every day | ORAL | 2 refills | Status: DC

## 2016-09-27 MED ORDER — TRAMADOL HCL 50 MG PO TABS: 50.0000 mg | ORAL_TABLET | Freq: Two times a day (BID) | ORAL | 5 refills | Status: DC | PRN

## 2016-09-27 MED ORDER — NIACIN ER (ANTIHYPERLIPIDEMIC) 1000 MG PO TBCR: 1000.0000 mg | EXTENDED_RELEASE_TABLET | Freq: Every day | ORAL | 2 refills | Status: DC

## 2016-09-27 MED ORDER — DIPHENOXYLATE-ATROPINE 2.5-0.025 MG PO TABS
2.0000 | ORAL_TABLET | Freq: Four times a day (QID) | ORAL | 2 refills | Status: DC | PRN
Start: 2016-09-27 — End: 2016-11-15

## 2016-09-27 MED ORDER — OMEPRAZOLE 40 MG PO CPDR: DELAYED_RELEASE_CAPSULE | ORAL | 2 refills | Status: AC

## 2016-09-27 MED ORDER — TESTOSTERONE CYPIONATE 200 MG/ML IM SOLN: INTRAMUSCULAR | 5 refills | Status: DC

## 2016-09-27 MED ORDER — SIMVASTATIN 40 MG PO TABS: 40.0000 mg | ORAL_TABLET | Freq: Every evening | ORAL | 2 refills | Status: DC

## 2016-09-27 MED ORDER — TRIAZOLAM 0.25 MG PO TABS: 0.2500 mg | ORAL_TABLET | Freq: Every evening | ORAL | 2 refills | Status: DC | PRN

## 2016-09-27 MED ORDER — BUDESONIDE 3 MG PO CPEP: ORAL_CAPSULE | ORAL | 3 refills | Status: DC

## 2016-09-27 MED ORDER — SERTRALINE HCL 50 MG PO TABS
50.0000 mg | ORAL_TABLET | Freq: Every day | ORAL | 2 refills | Status: AC
Start: 2016-09-27 — End: ?

## 2016-09-28 ENCOUNTER — Telehealth: Payer: Self-pay

## 2016-09-28 NOTE — Telephone Encounter (Signed)
Rx for Triazolam 0.25, Testosterone 200 mg/ml, Tramadol 50 mg, and Lomotil 2.5-0.025 mg was [facility]ed to [facility] this morning 09/28/2016 at 8.36 am and comfirmation has been received through fax.

## 2016-10-01 ENCOUNTER — Telehealth: Payer: Self-pay

## 2016-10-01 NOTE — Telephone Encounter (Signed)
Patients pharmacy [facility] called in regards to Tramadol 50 mg Rx, requested for the Rx quantity to be reduces and needs the diagnoses code for the Tramadol.

## 2016-10-04 ENCOUNTER — Other Ambulatory Visit: Payer: Self-pay | Admitting: *Deleted

## 2016-10-04 DIAGNOSIS — I728 Aneurysm of other specified arteries: Secondary | ICD-10-CM

## 2016-10-17 DIAGNOSIS — L821 Other seborrheic keratosis: Secondary | ICD-10-CM | POA: Diagnosis not present

## 2016-10-17 DIAGNOSIS — L570 Actinic keratosis: Secondary | ICD-10-CM | POA: Diagnosis not present

## 2016-11-07 ENCOUNTER — Encounter: Payer: Self-pay | Admitting: Vascular Surgery

## 2016-11-15 ENCOUNTER — Ambulatory Visit (INDEPENDENT_AMBULATORY_CARE_PROVIDER_SITE_OTHER): Payer: Medicare Other | Admitting: Vascular Surgery

## 2016-11-15 ENCOUNTER — Encounter: Payer: Self-pay | Admitting: Vascular Surgery

## 2016-11-15 ENCOUNTER — Ambulatory Visit (HOSPITAL_COMMUNITY)
Admission: RE | Admit: 2016-11-15 | Discharge: 2016-11-15 | Disposition: A | Discharge status: Home / Self Care | Payer: Medicare Other | Source: Ambulatory Visit | Attending: Vascular Surgery | Admitting: Vascular Surgery

## 2016-11-15 VITALS — BP 136/91 | HR 63 | Temp 97.7°F | Resp 16 | Ht 69.0 in | Wt 193.0 lb

## 2016-11-15 DIAGNOSIS — I728 Aneurysm of other specified arteries: Secondary | ICD-10-CM

## 2016-11-15 NOTE — Progress Notes (Signed)
Referring Physician: Dr Amador CunasKwiatkowski  Patient name: Steven [name] MRN: [id number] DOB: 11/18/1950 Sex: male  REASON FOR CONSULT: Celiac artery aneurysm  HPI: Steven [name] is a 66 y.o. male referred for evaluation of the celiac artery aneurysm found as an incidental finding on previous CT scan in February 2017. The patient denies any abdominal pain. He has no family history of aneurysms.  He exercises frequently. Other medical problems include hypertension hyperlipidemia which is well-controlled. He denies any prior trauma or previous episodes of pancreatitis.  Past Medical History:  Diagnosis Date  . GERD with stricture   . Hyperlipidemia   . Hypertension   . Situational stress    Past Surgical History:  Procedure Laterality Date  . cardiolite stresstest  fall 2010  . COLONOSCOPY  fall 2010  . UPPER GASTROINTESTINAL ENDOSCOPY  fall 2010    Family History  Problem Relation Age of Onset  . Cancer Mother        lung   . Lymphoma Father   . Coronary artery disease Sister     SOCIAL HISTORY: Social History   Social History  . Marital status: Single    Spouse name: N/A  . Number of children: N/A  . Years of education: N/A   Occupational History  . Not on file.   Social History Main Topics  . Smoking status: Never Smoker  . Smokeless tobacco: Never Used  . Alcohol use Yes  . Drug use: No  . Sexual activity: Not on file   Other Topics Concern  . Not on file   Social History Narrative  . No narrative on file    Allergies  Allergen Reactions  . Penicillins     unknown    Current Outpatient Prescriptions  Medication Sig Dispense Refill  . budesonide (ENTOCORT EC) 3 MG 24 hr capsule TK 3 CS PO QAM 270 capsule 3  . ibuprofen (ADVIL,MOTRIN) 800 MG tablet TAKE 1 TABLET BY MOUTH EVERY 8 HOURS AS NEEDED 30 tablet 2  . lisinopril (PRINIVIL,ZESTRIL) 20 MG tablet Take 1 tablet (20 mg total) by mouth daily. 90 tablet 2  . Multiple Vitamin (MULTIVITAMIN) tablet  Take 1 tablet by mouth daily.    . niacin (NIASPAN) 1000 MG CR tablet Take 1 tablet (1,000 mg total) by mouth at bedtime. 90 tablet 2  . omeprazole (PRILOSEC) 40 MG capsule TAKE 1 CAPSULE BY MOUTH EVERY DAY 90 capsule 2  . sertraline (ZOLOFT) 50 MG tablet Take 1 tablet (50 mg total) by mouth daily. 90 tablet 2  . simvastatin (ZOCOR) 40 MG tablet Take 1 tablet (40 mg total) by mouth every evening. 90 tablet 2  . testosterone cypionate (DEPOTESTOSTERONE CYPIONATE) 200 MG/ML injection INJECT 1.5 MLS IN THE MUSCLE EVERY 14 DAYS AS DIRECTED 10 mL 5  . triazolam (HALCION) 0.25 MG tablet Take 1 tablet (0.25 mg total) by mouth at bedtime as needed. 90 tablet 2   No current facility-administered medications for this visit.     ROS:   General:  No weight loss, Fever, chills  HEENT: No recent headaches, no nasal bleeding, no visual changes, no sore throat  Neurologic: No dizziness, blackouts, seizures. No recent symptoms of stroke or mini- stroke. No recent episodes of slurred speech, or temporary blindness.  Cardiac: No recent episodes of chest pain/pressure, no shortness of breath at rest.  No shortness of breath with exertion.  Denies history of atrial fibrillation or irregular heartbeat  Vascular: No history of rest pain in  feet.  No history of claudication.  No history of non-healing ulcer, No history of DVT   Pulmonary: No home oxygen, no productive cough, no hemoptysis,  No asthma or wheezing  Musculoskeletal:  [ ]  Arthritis, [ ]  Low back pain,  [ ]  Joint pain  Hematologic:No history of hypercoagulable state.  No history of easy bleeding.  No history of anemia  Gastrointestinal: No hematochezia or melena,  No gastroesophageal reflux, no trouble swallowing  Urinary: [ ]  chronic Kidney disease, [ ]  on HD - [ ]  MWF or [ ]  TTHS, [ ]  Burning with urination, [ ]  Frequent urination, [ ]  Difficulty urinating;   Skin: No rashes  Psychological: No history of anxiety,  No history of  depression   Physical Examination  Vitals:   11/15/16 0839  BP: (!) 136/91  Pulse: 63  Resp: 16  Temp: 97.7 F (36.5 C)  TempSrc: Oral  SpO2: 98%  Weight: 193 lb (87.5 kg)  Height: 5\' 9"  (1.753 m)    Body mass index is 28.5 kg/m.  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  The patient did have some areas of aortic calcification but no infrarenal or suprarenal abdominal aortic aneurysm or other mesenteric or renal aneurysms.  The patient had a mesenteric duplex exam today which showed the aneurysm diameter is still 1.6 cm.  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   PLAN:  Observation for now with yearly mesenteric ultrasound. If the aneurysm becomes symptomatic with abdominal pain or gross to greater than 2 cm in diameter we would consider repair at that point. Patient was informed to let any emergency room doctor know that he has a history of a celiac artery aneurysm if he ever presents with severe abdominal pain. I also discussed the patient today that she should most likely refrain from strenuous heavy lifting as part of his exercise regimen. Otherwise I believe any cardiovascular exercise would be okay for him.  He will follow-up with me with a repeat ultrasound in 1 year.  Fabienne Bruns, MD [facility] Office:  [phone] Pager: [phone]

## 2016-11-17 ENCOUNTER — Encounter: Payer: Self-pay | Admitting: Internal Medicine

## 2016-11-23 ENCOUNTER — Other Ambulatory Visit: Payer: Self-pay | Admitting: Internal Medicine

## 2016-11-23 DIAGNOSIS — E349 Endocrine disorder, unspecified: Secondary | ICD-10-CM

## 2016-11-29 NOTE — Addendum Note (Signed)
Addended by: Burton ApleyPETTY, Davetta Olliff A on: 11/29/2016 09:29 AM   Modules accepted: Orders

## 2017-01-03 DIAGNOSIS — J40 Bronchitis, not specified as acute or chronic: Secondary | ICD-10-CM | POA: Diagnosis not present

## 2017-01-03 DIAGNOSIS — Z6828 Body mass index (BMI) 28.0-28.9, adult: Secondary | ICD-10-CM | POA: Diagnosis not present

## 2017-01-13 DIAGNOSIS — J209 Acute bronchitis, unspecified: Secondary | ICD-10-CM | POA: Diagnosis not present

## 2017-01-14 ENCOUNTER — Encounter: Payer: Self-pay | Admitting: Internal Medicine

## 2017-01-17 ENCOUNTER — Encounter: Payer: Self-pay | Admitting: Internal Medicine

## 2017-01-29 DIAGNOSIS — S40012A Contusion of left shoulder, initial encounter: Secondary | ICD-10-CM | POA: Diagnosis not present

## 2017-01-30 ENCOUNTER — Other Ambulatory Visit: Payer: Self-pay | Admitting: Internal Medicine

## 2017-02-07 DIAGNOSIS — E349 Endocrine disorder, unspecified: Secondary | ICD-10-CM | POA: Diagnosis not present

## 2017-02-07 LAB — CBC AND DIFFERENTIAL
HCT: 46 (ref 41–53)
Hemoglobin: 14.6 (ref 13.5–17.5)
Platelets: 332 (ref 150–399)
WBC: 6.7

## 2017-02-13 ENCOUNTER — Encounter: Payer: Self-pay | Admitting: Internal Medicine

## 2017-03-03 ENCOUNTER — Other Ambulatory Visit: Payer: Self-pay | Admitting: Internal Medicine

## 2017-03-04 ENCOUNTER — Other Ambulatory Visit: Payer: Self-pay | Admitting: Internal Medicine

## 2017-03-21 DIAGNOSIS — R509 Fever, unspecified: Secondary | ICD-10-CM | POA: Diagnosis not present

## 2017-03-21 DIAGNOSIS — M791 Myalgia, unspecified site: Secondary | ICD-10-CM | POA: Diagnosis not present

## 2017-03-21 DIAGNOSIS — R066 Hiccough: Secondary | ICD-10-CM | POA: Diagnosis not present

## 2017-03-22 ENCOUNTER — Ambulatory Visit (INDEPENDENT_AMBULATORY_CARE_PROVIDER_SITE_OTHER): Payer: Medicare Other | Admitting: Family Medicine

## 2017-03-22 ENCOUNTER — Other Ambulatory Visit: Payer: Self-pay | Admitting: Family Medicine

## 2017-03-22 ENCOUNTER — Encounter: Payer: Self-pay | Admitting: Family Medicine

## 2017-03-22 VITALS — BP 134/80 | HR 97 | Temp 98.7°F | Ht 69.0 in | Wt 193.2 lb

## 2017-03-22 DIAGNOSIS — R066 Hiccough: Secondary | ICD-10-CM | POA: Diagnosis not present

## 2017-03-22 MED ORDER — BACLOFEN 10 MG PO TABS: 10.0000 mg | ORAL_TABLET | Freq: Three times a day (TID) | ORAL | 0 refills | Status: DC

## 2017-03-22 NOTE — Addendum Note (Signed)
Addended by: Sheliah HatchABORI, Lamisha Roussell E on: 03/22/2017 10:52 AM   Modules accepted: Orders

## 2017-03-22 NOTE — Patient Instructions (Signed)
Follow up as needed or as scheduled Start the Baclofen 3x/day- this may cause drowsiness as this is a muscle relaxer Drink plenty of fluids Call with any questions or concerns- particularly if not improving!!! Hang in there!!!

## 2017-03-22 NOTE — Progress Notes (Signed)
   Subjective:    Patient ID: Steven [name], male    DOB: 08/30/1950, 66 y.o.   MRN: [id number]  HPI Hiccups- sxs started ~3 days ago.  Hiccups are constant.  Has tried all home remedies he 'could find on the internet'.  Max relief has been 20 minutes when he held his breath and took 10 sips of water.  Took Halcion last night and was able to sleep but when he woke, hiccups continued.  Pt has had hiccups previously w/ vasectomy and colonoscopy- had a shot each time and 'it went away'.   Review of Systems For ROS see HPI     Objective:   Physical Exam  Constitutional: He is oriented to person, place, and time. He appears well-developed and well-nourished.  Obviously uncomfortable  HENT:  Head: Normocephalic and atraumatic.  + PND  Pulmonary/Chest: Effort normal and breath sounds normal. No respiratory distress. He has no wheezes. He has no rales.  Constant hiccups  Neurological: He is alert and oriented to person, place, and time.  Psychiatric: He has a normal mood and affect. His behavior is normal. Thought content normal.  Vitals reviewed.         Assessment & Plan:  Intractable hiccups- new.  Pt is already on PPI- which according to UTD is the 1st line treatment.  Based on this, will move on to 2nd line treatment which is baclofen for the diaphragmatic spasm.  If no improvement, may need additional medical workup for underlying cause.  Encouraged him to call PCP on Monday if no better.  Pt expressed understanding and is in agreement w/ plan.

## 2017-03-25 ENCOUNTER — Encounter: Payer: Self-pay | Admitting: Internal Medicine

## 2017-03-25 ENCOUNTER — Ambulatory Visit (INDEPENDENT_AMBULATORY_CARE_PROVIDER_SITE_OTHER): Payer: Medicare Other | Admitting: Internal Medicine

## 2017-03-25 VITALS — BP 108/74

## 2017-03-25 DIAGNOSIS — I10 Essential (primary) hypertension: Secondary | ICD-10-CM

## 2017-03-25 MED ORDER — TRIAZOLAM 0.25 MG PO TABS: 0.2500 mg | ORAL_TABLET | Freq: Every evening | ORAL | 2 refills | Status: DC | PRN

## 2017-03-25 MED ORDER — HYDROCODONE-HOMATROPINE 5-1.5 MG/5ML PO SYRP: 5.0000 mL | ORAL_SOLUTION | Freq: Four times a day (QID) | ORAL | 0 refills | Status: DC | PRN

## 2017-03-25 NOTE — Progress Notes (Signed)
Subjective:    Patient ID: Steven [name], male    DOB: 03/09/1951, 66 y.o.   MRN: [id number]  HPI  66 year old patient who developed cough 5 days ago.  He worsened on Thursday with fever and was seen at an urgent care where screening for influenza and streptococcal pharyngitis negative.  Fever spike to 101.7 degrees.  Prior to his acute febrile illness he sustained a fall 1 day prior.  His musculoskeletal pain is improving. Also during this period he developed severe intractable hiccups.  He was seen 2 days later at the [facility] clinic and prescribed baclofen which apparently was successful after 2 dosages but caused significant sedation.  Presently he is using cough drops and ibuprofen. He continues to improve but still unwell. Presently has a low-grade fever of 99.4 with residual cough but improving  Past Medical History:  Diagnosis Date  . GERD with stricture   . Hyperlipidemia   . Hypertension   . Situational stress      Social History   Socioeconomic History  . Marital status: Single    Spouse name: Not on file  . Number of children: Not on file  . Years of education: Not on file  . Highest education level: Not on file  Social Needs  . Financial resource strain: Not on file  . Food insecurity - worry: Not on file  . Food insecurity - inability: Not on file  . Transportation needs - medical: Not on file  . Transportation needs - non-medical: Not on file  Occupational History  . Not on file  Tobacco Use  . Smoking status: Never Smoker  . Smokeless tobacco: Never Used  Substance and Sexual Activity  . Alcohol use: Yes  . Drug use: No  . Sexual activity: Not on file  Other Topics Concern  . Not on file  Social History Narrative  . Not on file    Past Surgical History:  Procedure Laterality Date  . cardiolite stresstest  fall 2010  . COLONOSCOPY  fall 2010  . UPPER GASTROINTESTINAL ENDOSCOPY  fall 2010    Family History  Problem Relation Age of Onset  .  Cancer Mother        lung   . Lymphoma Father   . Coronary artery disease Sister     Allergies  Allergen Reactions  . Penicillins     unknown    Current Outpatient Medications on File Prior to Visit  Medication Sig Dispense Refill  . baclofen (LIORESAL) 10 MG tablet TAKE 1 TABLET(10 MG) BY MOUTH THREE TIMES DAILY 270 tablet 0  . budesonide (ENTOCORT EC) 3 MG 24 hr capsule TK 3 CS PO QAM 270 capsule 3  . ibuprofen (ADVIL,MOTRIN) 800 MG tablet TAKE 1 TABLET BY MOUTH EVERY 8 HOURS AS NEEDED 30 tablet 2  . lisinopril (PRINIVIL,ZESTRIL) 20 MG tablet Take 1 tablet (20 mg total) by mouth daily. 90 tablet 2  . lisinopril (PRINIVIL,ZESTRIL) 20 MG tablet TAKE 1 TABLET DAILY 90 tablet 2  . Multiple Vitamin (MULTIVITAMIN) tablet Take 1 tablet by mouth daily.    Marland Kitchen. NIASPAN 1000 MG CR tablet TAKE 1 TABLET AT BEDTIME 90 tablet 2  . omeprazole (PRILOSEC) 40 MG capsule TAKE 1 CAPSULE BY MOUTH EVERY DAY 90 capsule 2  . omeprazole (PRILOSEC) 40 MG capsule TAKE 1 CAPSULE DAILY 90 capsule 2  . sertraline (ZOLOFT) 50 MG tablet Take 1 tablet (50 mg total) by mouth daily. 90 tablet 2  . simvastatin (ZOCOR) 40 MG  tablet TAKE 1 TABLET EVERY EVENING 90 tablet 2  . testosterone cypionate (DEPOTESTOSTERONE CYPIONATE) 200 MG/ML injection INJECT 1.5 MLS IN THE MUSCLE EVERY 14 DAYS AS DIRECTED 10 mL 5   No current facility-administered medications on file prior to visit.     BP 108/74 (BP Location: Left Arm, Patient Position: Sitting, Cuff Size: Normal)     Review of Systems  Constitutional: Positive for activity change, appetite change, fatigue, fever and unexpected weight change. Negative for chills.  HENT: Positive for congestion and sore throat. Negative for dental problem, ear pain, hearing loss, tinnitus, trouble swallowing and voice change.   Eyes: Negative for pain, discharge and visual disturbance.  Respiratory: Positive for cough. Negative for chest tightness, wheezing and stridor.   Cardiovascular:  Negative for chest pain, palpitations and leg swelling.  Gastrointestinal: Negative for abdominal distention, abdominal pain, blood in stool, constipation, diarrhea, nausea and vomiting.  Genitourinary: Negative for difficulty urinating, discharge, flank pain, genital sores, hematuria and urgency.  Musculoskeletal: Positive for back pain and myalgias. Negative for arthralgias, gait problem, joint swelling and neck stiffness.  Skin: Negative for rash.  Neurological: Negative for dizziness, syncope, speech difficulty, weakness, numbness and headaches.  Hematological: Negative for adenopathy. Does not bruise/bleed easily.  Psychiatric/Behavioral: Negative for behavioral problems and dysphoric mood. The patient is not nervous/anxious.        Objective:   Physical Exam  Constitutional: He is oriented to person, place, and time. He appears well-developed. No distress.  Temperature 99.4 O2 saturation normal No distress Pulse rate 80  HENT:  Head: Normocephalic.  Right Ear: External ear normal.  Left Ear: External ear normal.  Mild erythema of the oropharynx  Eyes: Conjunctivae and EOM are normal.  Neck: Normal range of motion.  Cardiovascular: Normal rate and normal heart sounds.  Pulmonary/Chest: Breath sounds normal. He has no wheezes. He has no rales.  Abdominal: Bowel sounds are normal.  Musculoskeletal: Normal range of motion. He exhibits no edema or tenderness.  Neurological: He is alert and oriented to person, place, and time.  Psychiatric: He has a normal mood and affect. His behavior is normal.          Assessment & Plan:  Viral URI with cough.  Will treat symptomatically  Hiccups resolved Essential hypertension stable.  This has been treated with ACE inhibitor will continue with therapy at this time unchanged  Rogelia BogaKWIATKOWSKI,Cally Nygard FRANK

## 2017-03-25 NOTE — Patient Instructions (Addendum)
Acute bronchitis symptoms for less than 10 days are generally not helped by antibiotics.  Take over-the-counter expectorants and cough medications such as  Mucinex DM.  Call if there is no improvement in 5 to 7 days or if  you develop worsening cough, fever, or new symptoms, such as shortness of breath or chest pain.  Hydrate and Humidify  Drink enough water to keep your urine clear or pale yellow. Staying hydrated will help to thin your mucus.  Use a cool mist humidifier to keep the humidity level in your home above 50%.  Inhale steam for 10-15 minutes, 3-4 times a day or as told by your health care provider. You can do this in the bathroom while a hot shower is running.  Limit your exposure to cool or dry air. Rest  Rest as much as possible.  Sleep with your head raised (elevated).   Take 400-600 mg of ibuprofen ( Advil, Motrin) with food every 4 to 6 hours as needed for pain relief or control of fever

## 2017-04-02 ENCOUNTER — Telehealth: Payer: Self-pay | Admitting: Internal Medicine

## 2017-04-02 NOTE — Telephone Encounter (Signed)
Copied from CRM [id number]. Topic: Quick Communication - Rx Refill/Question >> Apr 02, 2017  2:44 PM Elliot GaultBell, Tiffany M wrote: Jethro Bolusaller Name: Cline CoolsDespain,Fran Reation to pt:  Spouse  selfCall back number: [phone] Pharmacy: [facility] [id number] - [place], [place] - [address] [phone] (Phone) [phone] (Fax)    Reason for call:  Spouse requesting HYDROcodone-homatropine (HYCODAN) 5-1.5 MG/5ML syrup refill, spouse states patient cough is not improving, please advise if Rx can be sent in today

## 2017-04-03 ENCOUNTER — Other Ambulatory Visit: Payer: Self-pay | Admitting: Internal Medicine

## 2017-04-03 MED ORDER — HYDROCODONE-HOMATROPINE 5-1.5 MG/5ML PO SYRP: 5.0000 mL | ORAL_SOLUTION | Freq: Four times a day (QID) | ORAL | 0 refills | Status: DC | PRN

## 2017-04-03 NOTE — Telephone Encounter (Signed)
Yes okay for Hycodan cough syrup

## 2017-04-03 NOTE — Telephone Encounter (Signed)
See below message, please advise

## 2017-04-03 NOTE — Telephone Encounter (Signed)
Pt notified Rx ready for pickup. Rx printed and signed.  

## 2017-04-03 NOTE — Telephone Encounter (Signed)
Rx printed awaiting to be signed.  

## 2017-05-17 DIAGNOSIS — M7542 Impingement syndrome of left shoulder: Secondary | ICD-10-CM | POA: Diagnosis not present

## 2017-05-17 DIAGNOSIS — M25512 Pain in left shoulder: Secondary | ICD-10-CM | POA: Diagnosis not present

## 2017-06-06 DIAGNOSIS — L718 Other rosacea: Secondary | ICD-10-CM | POA: Diagnosis not present

## 2017-06-06 DIAGNOSIS — L820 Inflamed seborrheic keratosis: Secondary | ICD-10-CM | POA: Diagnosis not present

## 2017-06-08 ENCOUNTER — Encounter: Payer: Self-pay | Admitting: Internal Medicine

## 2017-06-17 DIAGNOSIS — M7542 Impingement syndrome of left shoulder: Secondary | ICD-10-CM | POA: Diagnosis not present

## 2017-06-20 ENCOUNTER — Encounter: Payer: Self-pay | Admitting: Internal Medicine

## 2017-06-20 ENCOUNTER — Ambulatory Visit (INDEPENDENT_AMBULATORY_CARE_PROVIDER_SITE_OTHER): Payer: Medicare Other | Admitting: Internal Medicine

## 2017-06-20 VITALS — BP 120/80 | HR 72 | Temp 98.4°F | Ht 70.0 in | Wt 192.0 lb

## 2017-06-20 DIAGNOSIS — I10 Essential (primary) hypertension: Secondary | ICD-10-CM

## 2017-06-20 DIAGNOSIS — R7302 Impaired glucose tolerance (oral): Secondary | ICD-10-CM | POA: Diagnosis not present

## 2017-06-20 DIAGNOSIS — E349 Endocrine disorder, unspecified: Secondary | ICD-10-CM

## 2017-06-20 DIAGNOSIS — I728 Aneurysm of other specified arteries: Secondary | ICD-10-CM

## 2017-06-20 DIAGNOSIS — Z0000 Encounter for general adult medical examination without abnormal findings: Secondary | ICD-10-CM | POA: Diagnosis not present

## 2017-06-20 DIAGNOSIS — E7800 Pure hypercholesterolemia, unspecified: Secondary | ICD-10-CM
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
## 2017-06-20 NOTE — Patient Instructions (Addendum)
Limit your sodium (Salt) intake    It is important that you exercise regularly, at least 20 minutes 3 to 4 times per week.  If you develop chest pain or shortness of breath seek  medical attention.  Please check your blood pressure on a regular basis.  If it is consistently greater than 140/90, please make an office appointment.   Health Maintenance, Male A healthy lifestyle and preventive care is important for your health and wellness. Ask your health care provider about what schedule of regular examinations is right for you. What should I know about weight and diet? Eat a Healthy Diet  Eat plenty of vegetables, fruits, whole grains, low-fat dairy products, and lean protein.  Do not eat a lot of foods high in solid fats, added sugars, or salt.  Maintain a Healthy Weight Regular exercise can help you achieve or maintain a healthy weight. You should:  Do at least 150 minutes of exercise each week. The exercise should increase your heart rate and make you sweat (moderate-intensity exercise).  Do strength-training exercises at least twice a week.  Watch Your Levels of Cholesterol and Blood Lipids  Have your blood tested for lipids and cholesterol every 5 years starting at 66 years of age. If you are at high risk for heart disease, you should start having your blood tested when you are 66 years old. You may need to have your cholesterol levels checked more often if: ? Your lipid or cholesterol levels are high. ? You are older than 66 years of age. ? You are at high risk for heart disease.  What should I know about cancer screening? Many types of cancers can be detected early and may often be prevented. Lung Cancer  You should be screened every year for lung cancer if: ? You are a current smoker who has smoked for at least 30 years. ? You are a former smoker who has quit within the past 15 years.  Talk to your health care provider about your screening options, when you should start  screening, and how often you should be screened.  Colorectal Cancer  Routine colorectal cancer screening usually begins at 66 years of age and should be repeated every 5-10 years until you are 66 years old. You may need to be screened more often if early forms of precancerous polyps or small growths are found. Your health care provider may recommend screening at an earlier age if you have risk factors for colon cancer.  Your health care provider may recommend using home test kits to check for hidden blood in the stool.  A small camera at the end of a tube can be used to examine your colon (sigmoidoscopy or colonoscopy). This checks for the earliest forms of colorectal cancer.  Prostate and Testicular Cancer  Depending on your age and overall health, your health care provider may do certain tests to screen for prostate and testicular cancer.  Talk to your health care provider about any symptoms or concerns you have about testicular or prostate cancer.  Skin Cancer  Check your skin from head to toe regularly.  Tell your health care provider about any new moles or changes in moles, especially if: ? There is a change in a mole's size, shape, or color. ? You have a mole that is larger than a pencil eraser.  Always use sunscreen. Apply sunscreen liberally and repeat throughout the day.  Protect yourself by wearing long sleeves, pants, a wide-brimmed hat, and sunglasses when outside.  What should I know about heart disease, diabetes, and high blood pressure?  If you are 9-37 years of age, have your blood pressure checked every 3-5 years. If you are 61 years of age or older, have your blood pressure checked every year. You should have your blood pressure measured twice-once when you are at a hospital or clinic, and once when you are not at a hospital or clinic. Record the average of the two measurements. To check your blood pressure when you are not at a hospital or clinic, you can use: ? An  automated blood pressure machine at a pharmacy. ? A home blood pressure monitor.  Talk to your health care provider about your target blood pressure.  If you are between 17-84 years old, ask your health care provider if you should take aspirin to prevent heart disease.  Have regular diabetes screenings by checking your fasting blood sugar level. ? If you are at a normal weight and have a low risk for diabetes, have this test once every three years after the age of 22. ? If you are overweight and have a high risk for diabetes, consider being tested at a younger age or more often.  A one-time screening for abdominal aortic aneurysm (AAA) by ultrasound is recommended for men aged 5-75 years who are current or former smokers. What should I know about preventing infection? Hepatitis B If you have a higher risk for hepatitis B, you should be screened for this virus. Talk with your health care provider to find out if you are at risk for hepatitis B infection. Hepatitis C Blood testing is recommended for:  Everyone born from [birth year] through 1964.  Anyone with known risk factors for hepatitis C.  Sexually Transmitted Diseases (STDs)  You should be screened each year for STDs including gonorrhea and chlamydia if: ? You are sexually active and are younger than 66 years of age. ? You are older than 66 years of age and your health care provider tells you that you are at risk for this type of infection. ? Your sexual activity has changed since you were last screened and you are at an increased risk for chlamydia or gonorrhea. Ask your health care provider if you are at risk.  Talk with your health care provider about whether you are at high risk of being infected with HIV. Your health care provider may recommend a prescription medicine to help prevent HIV infection.  What else can I do?  Schedule regular health, dental, and eye exams.  Stay current with your vaccines (immunizations).  Do not use  any tobacco products, such as cigarettes, chewing tobacco, and e-cigarettes. If you need help quitting, ask your health care provider.  Limit alcohol intake to no more than 2 drinks per day. One drink equals 12 ounces of beer, 5 ounces of wine, or 1 ounces of hard liquor.  Do not use street drugs.  Do not share needles.  Ask your health care provider for help if you need support or information about quitting drugs.  Tell your health care provider if you often feel depressed.  Tell your health care provider if you have ever been abused or do not feel safe at home. This information is not intended to replace advice given to you by your health care provider. Make sure you discuss any questions you have with your health care provider. Document Released: 10/13/2007 Document Revised: 12/14/2015 Document Reviewed: 01/18/2015 Elsevier Interactive Patient Education  [employer].

## 2017-06-21 DIAGNOSIS — I10 Essential (primary) hypertension: Secondary | ICD-10-CM | POA: Diagnosis not present

## 2017-06-21 LAB — CBC WITH DIFFERENTIAL/PLATELET
BASOS ABS: 0 10*3/uL (ref 0.0–0.1)
Basophils Relative: 0.8 % (ref 0.0–3.0)
EOS ABS: 0 10*3/uL (ref 0.0–0.7)
Eosinophils Relative: 0.6 % (ref 0.0–5.0)
HCT: 38 % — ABNORMAL LOW (ref 39.0–52.0)
Hemoglobin: 12 g/dL — ABNORMAL LOW (ref 13.0–17.0)
LYMPHS ABS: 1.8 10*3/uL (ref 0.7–4.0)
Lymphocytes Relative: 32.8 % (ref 12.0–46.0)
MCHC: 31.6 g/dL (ref 30.0–36.0)
MCV: 77.3 fl — ABNORMAL LOW (ref 78.0–100.0)
MONO ABS: 0.7 10*3/uL (ref 0.1–1.0)
Monocytes Relative: 12.7 % — ABNORMAL HIGH (ref 3.0–12.0)
NEUTROS ABS: 2.9 10*3/uL (ref 1.4–7.7)
Neutrophils Relative %: 53.1 % (ref 43.0–77.0)
PLATELETS: 269 10*3/uL (ref 150.0–400.0)
RBC: 4.92 Mil/uL (ref 4.22–5.81)
RDW: 15.9 % — ABNORMAL HIGH (ref 11.5–15.5)
WBC: 5.5 10*3/uL (ref 4.0–10.5)

## 2017-06-21 LAB — COMPREHENSIVE METABOLIC PANEL
ALK PHOS: 52 U/L (ref 39–117)
ALT: 20 U/L (ref 0–53)
AST: 16 U/L (ref 0–37)
Albumin: 4.5 g/dL (ref 3.5–5.2)
BILIRUBIN TOTAL: 0.6 mg/dL (ref 0.2–1.2)
BUN: 12 mg/dL (ref 6–23)
CALCIUM: 9.8 mg/dL (ref 8.4–10.5)
CO2: 28 mEq/L (ref 19–32)
Chloride: 102 mEq/L (ref 96–112)
Creatinine, Ser: 0.92 mg/dL (ref 0.40–1.50)
GFR: 87.47 mL/min (ref >60.00)
GLUCOSE: 104 mg/dL — AB (ref 70–99)
POTASSIUM: 3.9 meq/L (ref 3.5–5.1)
Sodium: 138 mEq/L (ref 135–145)
TOTAL PROTEIN: 6.7 g/dL (ref 6.0–8.3)

## 2017-06-21 LAB — TESTOSTERONE: TESTOSTERONE: 1394.82 ng/dL — AB (ref 300.00–890.00)

## 2017-06-21 LAB — LIPID PANEL
CHOL/HDL RATIO: 3
Cholesterol: 183 mg/dL (ref 0–200)
HDL: 57 mg/dL (ref >39.00)
LDL Cholesterol: 110 mg/dL — ABNORMAL HIGH (ref 0–99)
NONHDL: 126.12
TRIGLYCERIDES: 80 mg/dL (ref 0.0–149.0)
VLDL: 16 mg/dL (ref 0.0–40.0)

## 2017-06-21 LAB — TSH: TSH: 2.35 u[IU]/mL (ref 0.35–4.50)

## 2017-06-21 LAB — PSA: PSA: 2.59 ng/mL (ref 0.10–4.00)

## 2017-06-22 LAB — HEPATITIS C ANTIBODY
HEP C AB: NONREACTIVE
SIGNAL TO CUT-OFF: 0.01 (ref <1.00)

## 2017-06-25 ENCOUNTER — Encounter: Payer: Self-pay | Admitting: Internal Medicine

## 2017-06-25 ENCOUNTER — Other Ambulatory Visit: Payer: Self-pay

## 2017-06-26 DIAGNOSIS — M25512 Pain in left shoulder: Secondary | ICD-10-CM | POA: Diagnosis not present

## 2017-06-26 DIAGNOSIS — M7542 Impingement syndrome of left shoulder: Secondary | ICD-10-CM | POA: Diagnosis not present

## 2017-06-28 ENCOUNTER — Other Ambulatory Visit: Payer: Self-pay

## 2017-06-28 ENCOUNTER — Encounter: Payer: Self-pay | Admitting: Internal Medicine

## 2017-06-28 MED ORDER — TESTOSTERONE CYPIONATE 200 MG/ML IM SOLN: INTRAMUSCULAR | 0 refills | Status: DC

## 2017-06-28 MED ORDER — TESTOSTERONE CYPIONATE 200 MG/ML IM SOLN: INTRAMUSCULAR | 5 refills | Status: DC

## 2017-06-28 NOTE — Telephone Encounter (Signed)
Last medication voided reprint for no refills per Dr.Jordan.

## 2017-07-04 DIAGNOSIS — M7542 Impingement syndrome of left shoulder: Secondary | ICD-10-CM | POA: Diagnosis not present

## 2017-08-08 DIAGNOSIS — E349 Endocrine disorder, unspecified: Secondary | ICD-10-CM | POA: Diagnosis not present

## 2017-08-08 DIAGNOSIS — I10 Essential (primary) hypertension: Secondary | ICD-10-CM | POA: Diagnosis not present

## 2017-08-21 ENCOUNTER — Encounter: Payer: Self-pay | Admitting: Internal Medicine

## 2017-08-30 ENCOUNTER — Encounter: Payer: Self-pay | Admitting: Internal Medicine

## 2017-09-05 ENCOUNTER — Encounter: Payer: Self-pay | Admitting: Internal Medicine

## 2017-09-06 DIAGNOSIS — J301 Allergic rhinitis due to pollen: Secondary | ICD-10-CM | POA: Diagnosis not present

## 2017-09-06 DIAGNOSIS — K219 Gastro-esophageal reflux disease without esophagitis: Secondary | ICD-10-CM | POA: Diagnosis not present

## 2017-09-11 DIAGNOSIS — D509 Iron deficiency anemia, unspecified: Secondary | ICD-10-CM | POA: Diagnosis not present

## 2017-09-12 ENCOUNTER — Telehealth: Payer: Self-pay | Admitting: Family Medicine

## 2017-09-12 ENCOUNTER — Other Ambulatory Visit: Payer: Self-pay

## 2017-09-12 MED ORDER — TRIAZOLAM 0.25 MG PO TABS: 0.2500 mg | ORAL_TABLET | Freq: Every evening | ORAL | 2 refills | Status: AC | PRN

## 2017-09-12 NOTE — Telephone Encounter (Signed)
Copied from CRM [id number]. Topic: General - Other >> Sep 12, 2017 10:51 AM Gerrianne Scale wrote: Reason for CRM: Victorino Dike from [facility] calling [phone] reference# [id number] pharmacist has a question about RX triazolam (HALCION) 0.25 MG tablet  they would like a call back today

## 2017-09-12 NOTE — Telephone Encounter (Signed)
Spoke to pharmacy. Medication phoned-in. Pt had refills but the Rx runs out after 6 months.

## 2017-09-12 NOTE — Telephone Encounter (Signed)
Called pt to inform him that his med.has refills on them.

## 2017-09-13 NOTE — Telephone Encounter (Signed)
Pt received medication. No further action needed.

## 2017-09-13 NOTE — Telephone Encounter (Signed)
Patient has contacted pharmacy and they are saying other wise. He would like for Enrique Sack to call him. [phone]

## 2017-09-13 NOTE — Telephone Encounter (Signed)
Spoke to patient and pharmacist. Rx was fixed pt was informed of update.

## 2017-09-18 ENCOUNTER — Other Ambulatory Visit: Payer: Self-pay

## 2017-09-18 ENCOUNTER — Telehealth: Payer: Self-pay

## 2017-09-18 MED ORDER — LOSARTAN POTASSIUM 100 MG PO TABS: 100.0000 mg | ORAL_TABLET | Freq: Every day | ORAL | 0 refills | Status: DC

## 2017-09-18 NOTE — Telephone Encounter (Signed)
Patient is to stop Lisinopril and start on Losartan. New Rx sent to [facility].

## 2017-09-26 DIAGNOSIS — D500 Iron deficiency anemia secondary to blood loss (chronic): Secondary | ICD-10-CM | POA: Insufficient documentation

## 2017-09-26 DIAGNOSIS — Z52008 Unspecified donor, other blood: Secondary | ICD-10-CM | POA: Diagnosis not present

## 2017-09-26 DIAGNOSIS — E349 Endocrine disorder, unspecified: Secondary | ICD-10-CM | POA: Diagnosis not present

## 2017-09-26 DIAGNOSIS — I728 Aneurysm of other specified arteries: Secondary | ICD-10-CM | POA: Diagnosis not present

## 2017-09-26 DIAGNOSIS — Z79890 Hormone replacement therapy (postmenopausal): Secondary | ICD-10-CM | POA: Diagnosis not present

## 2017-09-30 DIAGNOSIS — D500 Iron deficiency anemia secondary to blood loss (chronic): Secondary | ICD-10-CM | POA: Diagnosis not present

## 2017-10-04 ENCOUNTER — Other Ambulatory Visit: Payer: Self-pay | Admitting: Internal Medicine

## 2017-10-14 ENCOUNTER — Other Ambulatory Visit: Payer: Self-pay

## 2017-10-14 MED ORDER — LOSARTAN POTASSIUM 100 MG PO TABS: 100.0000 mg | ORAL_TABLET | Freq: Every day | ORAL | 0 refills | Status: DC

## 2017-10-14 NOTE — Telephone Encounter (Signed)
Losartan refilled. [facility].

## 2017-10-18 ENCOUNTER — Other Ambulatory Visit: Payer: Self-pay

## 2017-11-21 ENCOUNTER — Ambulatory Visit: Payer: Medicare Other | Admitting: Vascular Surgery

## 2017-11-21 ENCOUNTER — Encounter (HOSPITAL_COMMUNITY): Payer: Medicare Other

## 2017-11-25 DIAGNOSIS — D500 Iron deficiency anemia secondary to blood loss (chronic): Secondary | ICD-10-CM | POA: Diagnosis not present

## 2017-11-27 DIAGNOSIS — I728 Aneurysm of other specified arteries: Secondary | ICD-10-CM | POA: Diagnosis not present

## 2017-11-27 DIAGNOSIS — E349 Endocrine disorder, unspecified: Secondary | ICD-10-CM | POA: Diagnosis not present

## 2017-11-27 DIAGNOSIS — D500 Iron deficiency anemia secondary to blood loss (chronic): Secondary | ICD-10-CM | POA: Diagnosis not present

## 2017-11-28 ENCOUNTER — Other Ambulatory Visit: Payer: Self-pay

## 2017-11-28 ENCOUNTER — Encounter: Payer: Self-pay | Admitting: Vascular Surgery

## 2017-11-28 ENCOUNTER — Ambulatory Visit (INDEPENDENT_AMBULATORY_CARE_PROVIDER_SITE_OTHER): Payer: Medicare Other | Admitting: Vascular Surgery

## 2017-11-28 ENCOUNTER — Ambulatory Visit (HOSPITAL_COMMUNITY)
Admission: RE | Admit: 2017-11-28 | Discharge: 2017-11-28 | Disposition: A | Discharge status: Home / Self Care | Payer: Medicare Other | Source: Ambulatory Visit | Attending: Vascular Surgery | Admitting: Vascular Surgery

## 2017-11-28 VITALS — BP 138/89 | HR 62 | Resp 20 | Ht 70.0 in | Wt 189.4 lb

## 2017-11-28 DIAGNOSIS — E785 Hyperlipidemia, unspecified: Secondary | ICD-10-CM | POA: Insufficient documentation

## 2017-11-28 DIAGNOSIS — I728 Aneurysm of other specified arteries: Secondary | ICD-10-CM

## 2017-11-28 DIAGNOSIS — I10 Essential (primary) hypertension: Secondary | ICD-10-CM | POA: Diagnosis not present

## 2017-11-29 ENCOUNTER — Other Ambulatory Visit: Payer: Self-pay | Admitting: Internal Medicine

## 2017-11-29 NOTE — Progress Notes (Signed)
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  This was measured 1.6 cm in diameter in 2017.  He was last seen July 2018.  He reports no new symptoms of abdominal pain.  He is very active overall riding a mountain bike and doing as much overall exercise as he can without doing significant things that strain on his abdomen.  He has no new medical problems.  Medical problems were made hypertension and hyperlipidemia which are both currently stable.  He is on a statin.  Past Medical History:  Diagnosis Date  . GERD with stricture   . Hyperlipidemia   . Hypertension   . Situational stress    Past Surgical History:  Procedure Laterality Date  . cardiolite stresstest  fall 2010  . COLONOSCOPY  fall 2010  . UPPER GASTROINTESTINAL ENDOSCOPY  fall 2010    Current Outpatient Medications on File Prior to Visit  Medication Sig Dispense Refill  . budesonide (ENTOCORT EC) 3 MG 24 hr capsule TK 3 CS PO QAM 270 capsule 3  . Coenzyme Q10 60 MG CAPS Take by mouth.    Marland Kitchen ibuprofen (ADVIL,MOTRIN) 800 MG tablet TAKE 1 TABLET BY MOUTH EVERY 8 HOURS AS NEEDED 30 tablet 2  . losartan (COZAAR) 100 MG tablet Take 1 tablet (100 mg total) by mouth daily. 90 tablet 0  . minocycline (MINOCIN,DYNACIN) 100 MG capsule     . Multiple Vitamin (MULTIVITAMIN) tablet Take 1 tablet by mouth daily.    Marland Kitchen NIASPAN 1000 MG CR tablet TAKE 1 TABLET AT BEDTIME 90 tablet 2  . Omega-3 Fatty Acids (FISH OIL) 1000 MG CAPS Take by mouth.    Marland Kitchen omeprazole (PRILOSEC) 40 MG capsule TAKE 1 CAPSULE BY MOUTH EVERY DAY 90 capsule 2  . PICATO 0.015 % GEL     . sertraline (ZOLOFT) 50 MG tablet Take 1 tablet (50 mg total) by mouth daily. 90 tablet 2  . simvastatin (ZOCOR) 40 MG tablet TAKE 1 TABLET EVERY EVENING 90 tablet 2  . testosterone cypionate (DEPO-TESTOSTERONE) 200 MG/ML injection Depo-Testosterone 200 mg/mL intramuscular oil    . testosterone cypionate (DEPOTESTOSTERONE CYPIONATE) 200  MG/ML injection INJECT 1.5 MLS IN THE MUSCLE EVERY 14 DAYS AS DIRECTED 10 mL 0  . traMADol (ULTRAM) 50 MG tablet Take by mouth.    . triazolam (HALCION) 0.25 MG tablet Take 1 tablet (0.25 mg total) by mouth at bedtime as needed. 90 tablet 2  . Clindamycin-Benzoyl Per, Refr, gel     . HYDROcodone-homatropine (HYCODAN) 5-1.5 MG/5ML syrup Take 5 mLs by mouth every 6 (six) hours as needed for cough. (Steven [name] not taking: Reported on 11/28/2017) 120 mL 0   No current facility-administered medications on file prior to visit.      Physical exam:  Vitals:   11/28/17 0840  BP: 138/89  Pulse: 62  Resp: 20  SpO2: 99%  Weight: 189 lb 6.4 oz (85.9 kg)  Height: 5\' 10"  (1.778 m)    Neck: No carotid bruits  Chest: Clear to auscultation bilaterally  Cardiac: Regular rate and rhythm  Abdomen: Soft nontender nondistended no abdominal bruits  Extremities: 2+ femoral pulses 2+ dorsalis pedis and posterior tibial pulses bilaterally  Data: Steven [name] had a mesenteric duplex exam today which shows wide patency of the celiac and SMA origins with the celiac artery aneurysm still measuring 1.6 cm.  This is unchanged from 2017.  I reviewed and interpreted the study.  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  Consideration for repair if greater than 2 cm.  Fabienne Brunsharles Moo Gravley, MD [facility] Office: [phone] Pager: [phone]

## 2017-12-03 DIAGNOSIS — D500 Iron deficiency anemia secondary to blood loss (chronic): Secondary | ICD-10-CM | POA: Diagnosis not present

## 2017-12-06 DIAGNOSIS — K52838 Other microscopic colitis: Secondary | ICD-10-CM | POA: Diagnosis not present

## 2017-12-06 DIAGNOSIS — H9190 Unspecified hearing loss, unspecified ear: Secondary | ICD-10-CM | POA: Diagnosis not present

## 2017-12-06 DIAGNOSIS — D500 Iron deficiency anemia secondary to blood loss (chronic): Secondary | ICD-10-CM | POA: Diagnosis not present

## 2017-12-10 DIAGNOSIS — H903 Sensorineural hearing loss, bilateral: Secondary | ICD-10-CM | POA: Diagnosis not present

## 2017-12-10 DIAGNOSIS — H9313 Tinnitus, bilateral: Secondary | ICD-10-CM | POA: Diagnosis not present

## 2017-12-29 ENCOUNTER — Other Ambulatory Visit: Payer: Self-pay | Admitting: Internal Medicine

## 2018-01-24 ENCOUNTER — Ambulatory Visit (INDEPENDENT_AMBULATORY_CARE_PROVIDER_SITE_OTHER): Payer: Medicare Other

## 2018-01-24 DIAGNOSIS — Z23 Encounter for immunization: Secondary | ICD-10-CM | POA: Diagnosis not present

## 2018-01-27 NOTE — Progress Notes (Signed)
Per orders of Dr. Tawanna Cooler , injection of flu and pneumonia vaccine given by Sherrin Daisy. Patient tolerated injection well.

## 2018-02-10 DIAGNOSIS — E349 Endocrine disorder, unspecified: Secondary | ICD-10-CM | POA: Diagnosis not present

## 2018-02-13 DIAGNOSIS — M7072 Other bursitis of hip, left hip: Secondary | ICD-10-CM | POA: Diagnosis not present

## 2018-02-17 DIAGNOSIS — M5031 Other cervical disc degeneration,  high cervical region: Secondary | ICD-10-CM | POA: Diagnosis not present

## 2018-02-17 DIAGNOSIS — M47812 Spondylosis without myelopathy or radiculopathy, cervical region: Secondary | ICD-10-CM | POA: Diagnosis not present

## 2018-02-24 DIAGNOSIS — M7072 Other bursitis of hip, left hip: Secondary | ICD-10-CM | POA: Diagnosis not present

## 2018-02-28 DIAGNOSIS — M7072 Other bursitis of hip, left hip: Secondary | ICD-10-CM | POA: Diagnosis not present

## 2018-03-05 DIAGNOSIS — M545 Low back pain: Secondary | ICD-10-CM | POA: Diagnosis not present

## 2018-03-05 DIAGNOSIS — M25552 Pain in left hip: Secondary | ICD-10-CM | POA: Diagnosis not present

## 2018-03-06 ENCOUNTER — Other Ambulatory Visit: Payer: Self-pay | Admitting: Internal Medicine

## 2018-03-06 ENCOUNTER — Other Ambulatory Visit: Payer: Self-pay | Admitting: Orthopedic Surgery

## 2018-03-06 DIAGNOSIS — M545 Low back pain, unspecified: Secondary | ICD-10-CM

## 2018-03-06 DIAGNOSIS — G8929 Other chronic pain: Secondary | ICD-10-CM

## 2018-03-07 NOTE — Telephone Encounter (Signed)
Pt aware via vm to follow up with new PCP for further refills due to Dr.Kwiakowski's retiring. Rx refilled.

## 2018-03-13 ENCOUNTER — Other Ambulatory Visit: Payer: Self-pay

## 2018-03-16 ENCOUNTER — Other Ambulatory Visit: Payer: Self-pay | Admitting: Internal Medicine

## 2018-03-17 DIAGNOSIS — M1612 Unilateral primary osteoarthritis, left hip: Secondary | ICD-10-CM | POA: Diagnosis not present

## 2018-03-17 DIAGNOSIS — M1388 Other specified arthritis, other site: Secondary | ICD-10-CM | POA: Diagnosis not present

## 2018-03-17 DIAGNOSIS — M5136 Other intervertebral disc degeneration, lumbar region: Secondary | ICD-10-CM | POA: Diagnosis not present

## 2018-03-17 DIAGNOSIS — M545 Low back pain: Secondary | ICD-10-CM | POA: Diagnosis not present

## 2018-03-18 ENCOUNTER — Telehealth: Payer: Self-pay | Admitting: Internal Medicine

## 2018-03-18 DIAGNOSIS — D500 Iron deficiency anemia secondary to blood loss (chronic): Secondary | ICD-10-CM | POA: Diagnosis not present

## 2018-03-18 NOTE — Telephone Encounter (Signed)
Copied from CRM [id number]. Topic: Quick Communication - Rx Refill/Question >> Mar 18, 2018 10:53 AM Herby AbrahamJohnson, Steven C wrote: Medication: triazolam (HALCION) 0.25 MG tablet--- 90 day supply   Has the patient contacted their pharmacy? Yes   (Agent: If no, request that the patient contact the pharmacy for the refill.) (Agent: If yes, when and what did the pharmacy advise?)  Preferred Pharmacy (with phone number or street name): [facility] [place] [address] [phone] (Phone) [phone] (Fax)    Agent: Please be advised that RX refills may take up to 3 business days. We ask that you follow-up with your pharmacy.

## 2018-03-19 NOTE — Telephone Encounter (Signed)
TC to patient. Last pick up Halcion was 02/12/18   90 day supply. He is not requesting a refill at this time. Reports he will not be returning to this office instead will be using [facility]. States he is saddened to see Dr. Kirtland BouchardK retire. Halcion refill request by [facility] will be denied at this time.

## 2018-03-20 ENCOUNTER — Other Ambulatory Visit: Payer: Medicare Other

## 2018-03-24 DIAGNOSIS — E349 Endocrine disorder, unspecified: Secondary | ICD-10-CM | POA: Diagnosis not present

## 2018-03-24 DIAGNOSIS — R4586 Emotional lability: Secondary | ICD-10-CM | POA: Diagnosis not present

## 2018-03-24 DIAGNOSIS — Z0000 Encounter for general adult medical examination without abnormal findings: Secondary | ICD-10-CM | POA: Diagnosis not present

## 2018-03-24 DIAGNOSIS — D500 Iron deficiency anemia secondary to blood loss (chronic): Secondary | ICD-10-CM | POA: Diagnosis not present

## 2018-03-24 DIAGNOSIS — N400 Enlarged prostate without lower urinary tract symptoms: Secondary | ICD-10-CM | POA: Diagnosis not present

## 2018-03-24 DIAGNOSIS — I728 Aneurysm of other specified arteries: Secondary | ICD-10-CM | POA: Diagnosis not present

## 2018-03-24 DIAGNOSIS — I10 Essential (primary) hypertension: Secondary | ICD-10-CM | POA: Diagnosis not present

## 2018-03-24 DIAGNOSIS — E785 Hyperlipidemia, unspecified: Secondary | ICD-10-CM | POA: Diagnosis not present

## 2018-03-24 DIAGNOSIS — G479 Sleep disorder, unspecified: Secondary | ICD-10-CM | POA: Diagnosis not present

## 2018-03-29 DIAGNOSIS — M545 Low back pain: Secondary | ICD-10-CM | POA: Diagnosis not present

## 2018-04-01 DIAGNOSIS — M7072 Other bursitis of hip, left hip: Secondary | ICD-10-CM | POA: Diagnosis not present

## 2018-04-02 DIAGNOSIS — J069 Acute upper respiratory infection, unspecified: Secondary | ICD-10-CM | POA: Diagnosis not present

## 2018-04-02 DIAGNOSIS — J029 Acute pharyngitis, unspecified: Secondary | ICD-10-CM | POA: Diagnosis not present

## 2018-04-04 DIAGNOSIS — E349 Endocrine disorder, unspecified: Secondary | ICD-10-CM | POA: Diagnosis not present

## 2018-04-04 DIAGNOSIS — D500 Iron deficiency anemia secondary to blood loss (chronic): Secondary | ICD-10-CM | POA: Diagnosis not present

## 2018-04-04 DIAGNOSIS — I10 Essential (primary) hypertension: Secondary | ICD-10-CM | POA: Diagnosis not present

## 2018-04-04 DIAGNOSIS — D751 Secondary polycythemia: Secondary | ICD-10-CM | POA: Diagnosis not present

## 2018-04-04 DIAGNOSIS — E291 Testicular hypofunction: Secondary | ICD-10-CM | POA: Diagnosis not present

## 2018-04-04 DIAGNOSIS — D72821 Monocytosis (symptomatic): Secondary | ICD-10-CM | POA: Diagnosis not present

## 2018-04-06 DIAGNOSIS — J209 Acute bronchitis, unspecified: Secondary | ICD-10-CM | POA: Diagnosis not present

## 2018-04-06 DIAGNOSIS — J069 Acute upper respiratory infection, unspecified: Secondary | ICD-10-CM | POA: Diagnosis not present

## 2018-04-10 DIAGNOSIS — M7070 Other bursitis of hip, unspecified hip: Secondary | ICD-10-CM | POA: Diagnosis not present

## 2018-04-10 DIAGNOSIS — M1388 Other specified arthritis, other site: Secondary | ICD-10-CM | POA: Diagnosis not present

## 2018-04-10 DIAGNOSIS — M545 Low back pain: Secondary | ICD-10-CM | POA: Diagnosis not present

## 2018-04-10 DIAGNOSIS — M5136 Other intervertebral disc degeneration, lumbar region: Secondary | ICD-10-CM | POA: Diagnosis not present

## 2018-04-10 DIAGNOSIS — M5416 Radiculopathy, lumbar region: Secondary | ICD-10-CM | POA: Diagnosis not present

## 2018-04-10 DIAGNOSIS — M62459 Contracture of muscle, unspecified thigh: Secondary | ICD-10-CM | POA: Diagnosis not present

## 2018-04-14 DIAGNOSIS — I728 Aneurysm of other specified arteries: Secondary | ICD-10-CM | POA: Diagnosis not present

## 2018-04-14 DIAGNOSIS — K52838 Other microscopic colitis: Secondary | ICD-10-CM | POA: Diagnosis not present

## 2018-04-14 DIAGNOSIS — D500 Iron deficiency anemia secondary to blood loss (chronic): Secondary | ICD-10-CM | POA: Diagnosis not present

## 2018-05-08 DIAGNOSIS — Z23 Encounter for immunization: Secondary | ICD-10-CM | POA: Diagnosis not present

## 2018-05-08 DIAGNOSIS — D500 Iron deficiency anemia secondary to blood loss (chronic): Secondary | ICD-10-CM | POA: Diagnosis not present

## 2018-05-27 DIAGNOSIS — M5136 Other intervertebral disc degeneration, lumbar region: Secondary | ICD-10-CM | POA: Diagnosis not present

## 2018-05-27 DIAGNOSIS — M7062 Trochanteric bursitis, left hip: Secondary | ICD-10-CM | POA: Diagnosis not present

## 2018-05-27 DIAGNOSIS — M62459 Contracture of muscle, unspecified thigh: Secondary | ICD-10-CM | POA: Diagnosis not present

## 2018-05-27 DIAGNOSIS — M7070 Other bursitis of hip, unspecified hip: Secondary | ICD-10-CM | POA: Diagnosis not present

## 2018-05-27 DIAGNOSIS — M7542 Impingement syndrome of left shoulder: Secondary | ICD-10-CM | POA: Diagnosis not present

## 2018-05-27 DIAGNOSIS — M1388 Other specified arthritis, other site: Secondary | ICD-10-CM | POA: Diagnosis not present

## 2018-05-27 DIAGNOSIS — M545 Low back pain: Secondary | ICD-10-CM | POA: Diagnosis not present

## 2018-05-27 DIAGNOSIS — M1612 Unilateral primary osteoarthritis, left hip: Secondary | ICD-10-CM | POA: Diagnosis not present

## 2018-05-27 DIAGNOSIS — M5416 Radiculopathy, lumbar region: Secondary | ICD-10-CM | POA: Diagnosis not present

## 2018-05-28 DIAGNOSIS — M545 Low back pain: Secondary | ICD-10-CM | POA: Diagnosis not present

## 2018-06-03 DIAGNOSIS — M9903 Segmental and somatic dysfunction of lumbar region: Secondary | ICD-10-CM | POA: Diagnosis not present

## 2018-06-03 DIAGNOSIS — M5442 Lumbago with sciatica, left side: Secondary | ICD-10-CM | POA: Diagnosis not present

## 2018-06-03 DIAGNOSIS — M461 Sacroiliitis, not elsewhere classified: Secondary | ICD-10-CM | POA: Diagnosis not present

## 2018-06-03 DIAGNOSIS — M9905 Segmental and somatic dysfunction of pelvic region: Secondary | ICD-10-CM | POA: Diagnosis not present

## 2018-06-04 DIAGNOSIS — M5442 Lumbago with sciatica, left side: Secondary | ICD-10-CM | POA: Diagnosis not present

## 2018-06-04 DIAGNOSIS — M461 Sacroiliitis, not elsewhere classified: Secondary | ICD-10-CM | POA: Diagnosis not present

## 2018-06-04 DIAGNOSIS — M9903 Segmental and somatic dysfunction of lumbar region: Secondary | ICD-10-CM | POA: Diagnosis not present

## 2018-06-04 DIAGNOSIS — M9905 Segmental and somatic dysfunction of pelvic region: Secondary | ICD-10-CM | POA: Diagnosis not present

## 2018-06-05 DIAGNOSIS — M5442 Lumbago with sciatica, left side: Secondary | ICD-10-CM | POA: Diagnosis not present

## 2018-06-05 DIAGNOSIS — M9903 Segmental and somatic dysfunction of lumbar region: Secondary | ICD-10-CM | POA: Diagnosis not present

## 2018-06-05 DIAGNOSIS — M461 Sacroiliitis, not elsewhere classified: Secondary | ICD-10-CM | POA: Diagnosis not present

## 2018-06-05 DIAGNOSIS — M9905 Segmental and somatic dysfunction of pelvic region: Secondary | ICD-10-CM | POA: Diagnosis not present

## 2018-06-09 DIAGNOSIS — M461 Sacroiliitis, not elsewhere classified: Secondary | ICD-10-CM | POA: Diagnosis not present

## 2018-06-09 DIAGNOSIS — M9905 Segmental and somatic dysfunction of pelvic region: Secondary | ICD-10-CM | POA: Diagnosis not present

## 2018-06-09 DIAGNOSIS — M5442 Lumbago with sciatica, left side: Secondary | ICD-10-CM | POA: Diagnosis not present

## 2018-06-09 DIAGNOSIS — M9903 Segmental and somatic dysfunction of lumbar region: Secondary | ICD-10-CM | POA: Diagnosis not present

## 2018-06-10 DIAGNOSIS — M545 Low back pain: Secondary | ICD-10-CM | POA: Diagnosis not present

## 2018-06-12 DIAGNOSIS — L821 Other seborrheic keratosis: Secondary | ICD-10-CM | POA: Diagnosis not present

## 2018-06-12 DIAGNOSIS — M545 Low back pain: Secondary | ICD-10-CM | POA: Diagnosis not present

## 2018-06-12 DIAGNOSIS — L570 Actinic keratosis: Secondary | ICD-10-CM | POA: Diagnosis not present

## 2018-06-12 DIAGNOSIS — L718 Other rosacea: Secondary | ICD-10-CM | POA: Diagnosis not present

## 2018-06-13 DIAGNOSIS — M461 Sacroiliitis, not elsewhere classified: Secondary | ICD-10-CM | POA: Diagnosis not present

## 2018-06-13 DIAGNOSIS — M5442 Lumbago with sciatica, left side: Secondary | ICD-10-CM | POA: Diagnosis not present

## 2018-06-13 DIAGNOSIS — M9905 Segmental and somatic dysfunction of pelvic region: Secondary | ICD-10-CM | POA: Diagnosis not present

## 2018-06-13 DIAGNOSIS — M9903 Segmental and somatic dysfunction of lumbar region: Secondary | ICD-10-CM | POA: Diagnosis not present

## 2018-06-16 DIAGNOSIS — M461 Sacroiliitis, not elsewhere classified: Secondary | ICD-10-CM | POA: Diagnosis not present

## 2018-06-16 DIAGNOSIS — M9905 Segmental and somatic dysfunction of pelvic region: Secondary | ICD-10-CM | POA: Diagnosis not present

## 2018-06-16 DIAGNOSIS — M9903 Segmental and somatic dysfunction of lumbar region: Secondary | ICD-10-CM | POA: Diagnosis not present

## 2018-06-16 DIAGNOSIS — M5442 Lumbago with sciatica, left side: Secondary | ICD-10-CM | POA: Diagnosis not present

## 2018-06-17 DIAGNOSIS — M545 Low back pain: Secondary | ICD-10-CM | POA: Diagnosis not present

## 2018-06-20 DIAGNOSIS — M9905 Segmental and somatic dysfunction of pelvic region: Secondary | ICD-10-CM | POA: Diagnosis not present

## 2018-06-20 DIAGNOSIS — M5442 Lumbago with sciatica, left side: Secondary | ICD-10-CM | POA: Diagnosis not present

## 2018-06-20 DIAGNOSIS — M9903 Segmental and somatic dysfunction of lumbar region: Secondary | ICD-10-CM | POA: Diagnosis not present

## 2018-06-20 DIAGNOSIS — M461 Sacroiliitis, not elsewhere classified: Secondary | ICD-10-CM | POA: Diagnosis not present

## 2018-06-23 DIAGNOSIS — M9903 Segmental and somatic dysfunction of lumbar region: Secondary | ICD-10-CM | POA: Diagnosis not present

## 2018-06-23 DIAGNOSIS — M5442 Lumbago with sciatica, left side: Secondary | ICD-10-CM | POA: Diagnosis not present

## 2018-06-23 DIAGNOSIS — M9905 Segmental and somatic dysfunction of pelvic region: Secondary | ICD-10-CM | POA: Diagnosis not present

## 2018-06-23 DIAGNOSIS — M461 Sacroiliitis, not elsewhere classified: Secondary | ICD-10-CM | POA: Diagnosis not present

## 2018-06-25 DIAGNOSIS — M5442 Lumbago with sciatica, left side: Secondary | ICD-10-CM | POA: Diagnosis not present

## 2018-06-25 DIAGNOSIS — M9903 Segmental and somatic dysfunction of lumbar region: Secondary | ICD-10-CM | POA: Diagnosis not present

## 2018-06-25 DIAGNOSIS — M461 Sacroiliitis, not elsewhere classified: Secondary | ICD-10-CM | POA: Diagnosis not present

## 2018-06-25 DIAGNOSIS — M9905 Segmental and somatic dysfunction of pelvic region: Secondary | ICD-10-CM | POA: Diagnosis not present

## 2018-06-26 DIAGNOSIS — M545 Low back pain: Secondary | ICD-10-CM | POA: Diagnosis not present

## 2018-06-27 DIAGNOSIS — M5442 Lumbago with sciatica, left side: Secondary | ICD-10-CM | POA: Diagnosis not present

## 2018-06-27 DIAGNOSIS — M461 Sacroiliitis, not elsewhere classified: Secondary | ICD-10-CM | POA: Diagnosis not present

## 2018-06-27 DIAGNOSIS — M9903 Segmental and somatic dysfunction of lumbar region: Secondary | ICD-10-CM | POA: Diagnosis not present

## 2018-06-27 DIAGNOSIS — M9905 Segmental and somatic dysfunction of pelvic region: Secondary | ICD-10-CM | POA: Diagnosis not present

## 2018-06-29 DIAGNOSIS — J069 Acute upper respiratory infection, unspecified: Secondary | ICD-10-CM | POA: Diagnosis not present

## 2018-06-29 DIAGNOSIS — J029 Acute pharyngitis, unspecified: Secondary | ICD-10-CM | POA: Diagnosis not present

## 2018-07-01 DIAGNOSIS — M9903 Segmental and somatic dysfunction of lumbar region: Secondary | ICD-10-CM | POA: Diagnosis not present

## 2018-07-01 DIAGNOSIS — M461 Sacroiliitis, not elsewhere classified: Secondary | ICD-10-CM | POA: Diagnosis not present

## 2018-07-01 DIAGNOSIS — M5442 Lumbago with sciatica, left side: Secondary | ICD-10-CM | POA: Diagnosis not present

## 2018-07-01 DIAGNOSIS — M9905 Segmental and somatic dysfunction of pelvic region: Secondary | ICD-10-CM | POA: Diagnosis not present

## 2018-07-07 ENCOUNTER — Other Ambulatory Visit: Payer: Self-pay

## 2018-07-07 DIAGNOSIS — I700 Atherosclerosis of aorta: Secondary | ICD-10-CM

## 2018-07-07 DIAGNOSIS — I728 Aneurysm of other specified arteries: Secondary | ICD-10-CM

## 2018-07-07 DIAGNOSIS — M461 Sacroiliitis, not elsewhere classified: Secondary | ICD-10-CM | POA: Diagnosis not present

## 2018-07-07 DIAGNOSIS — M9905 Segmental and somatic dysfunction of pelvic region: Secondary | ICD-10-CM | POA: Diagnosis not present

## 2018-07-07 DIAGNOSIS — M9903 Segmental and somatic dysfunction of lumbar region: Secondary | ICD-10-CM | POA: Diagnosis not present

## 2018-07-07 DIAGNOSIS — M5442 Lumbago with sciatica, left side: Secondary | ICD-10-CM | POA: Diagnosis not present

## 2018-07-10 ENCOUNTER — Ambulatory Visit (INDEPENDENT_AMBULATORY_CARE_PROVIDER_SITE_OTHER): Payer: Medicare Other | Admitting: Vascular Surgery

## 2018-07-10 ENCOUNTER — Ambulatory Visit (HOSPITAL_COMMUNITY)
Admission: RE | Admit: 2018-07-10 | Discharge: 2018-07-10 | Disposition: A | Discharge status: Home / Self Care | Payer: Medicare Other | Source: Ambulatory Visit | Attending: Vascular Surgery | Admitting: Vascular Surgery

## 2018-07-10 ENCOUNTER — Other Ambulatory Visit: Payer: Self-pay

## 2018-07-10 ENCOUNTER — Encounter: Payer: Self-pay | Admitting: Vascular Surgery

## 2018-07-10 VITALS — BP 143/94 | HR 60 | Temp 98.0°F | Resp 16 | Ht 70.0 in | Wt 191.0 lb

## 2018-07-10 DIAGNOSIS — M461 Sacroiliitis, not elsewhere classified: Secondary | ICD-10-CM | POA: Diagnosis not present

## 2018-07-10 DIAGNOSIS — M5442 Lumbago with sciatica, left side: Secondary | ICD-10-CM | POA: Diagnosis not present

## 2018-07-10 DIAGNOSIS — M9905 Segmental and somatic dysfunction of pelvic region: Secondary | ICD-10-CM | POA: Diagnosis not present

## 2018-07-10 DIAGNOSIS — I700 Atherosclerosis of aorta: Secondary | ICD-10-CM | POA: Diagnosis not present

## 2018-07-10 DIAGNOSIS — I728 Aneurysm of other specified arteries: Secondary | ICD-10-CM | POA: Diagnosis not present

## 2018-07-10 DIAGNOSIS — M9903 Segmental and somatic dysfunction of lumbar region: Secondary | ICD-10-CM | POA: Diagnosis not present

## 2018-07-10 NOTE — Progress Notes (Signed)
Patient is a 68 year old male who returns for follow-up today.  He has known history of a celiac artery aneurysm.  He apparently was sent for further evaluation after calcified aorta was noted on a spine film to make sure he had no abdominal aortic aneurysm.  He denies any abdominal pain.  He has no food fear.  He has not been losing weight.  He is very active overall and rides his bicycle frequently.  Chronic medical problems remain reflux hyperlipidemia hypertension all of which are currently stable.  Past Medical History:  Diagnosis Date  . GERD with stricture   . Hyperlipidemia   . Hypertension   . Situational stress     Past Surgical History:  Procedure Laterality Date  . cardiolite stresstest  fall 2010  . COLONOSCOPY  fall 2010  . UPPER GASTROINTESTINAL ENDOSCOPY  fall 2010    Review of systems: He denies shortness of breath.  He denies chest pain.  Physical exam:  Vitals:   07/10/18 1014  BP: (!) 143/94  Pulse: 60  Resp: 16  Temp: 98 F (36.7 C)  TempSrc: Oral  SpO2: 100%  Weight: 191 lb (86.6 kg)  Height: 5\' 10"  (1.778 m)    Neck: No carotid bruits  Chest: Clear to auscultation bilaterally  Cardiac: Regular rate and rhythm  Abdomen: Soft nontender no pulsatile mass  Extremities: 2+ femoral pulses  Data: Patient had a duplex ultrasound today which showed that the celiac artery aneurysm is 1.8 cm.  No significant dilation of his abdominal aorta with maximum diameter 2.3 cm.  Assessment: Stable celiac artery aneurysm 1.8 cm diameter.  We will consider repair if greater than 2 cm.  No evidence of abdominal aortic aneurysm.  Plan: The patient will have a follow-up mesenteric duplex ultrasound in 1 year and see our nurse practitioner.  Fabienne Bruns, MD [facility] [place] Office: [phone] Pager: [phone]

## 2018-08-12 DIAGNOSIS — R350 Frequency of micturition: Secondary | ICD-10-CM | POA: Diagnosis not present

## 2018-08-12 DIAGNOSIS — E349 Endocrine disorder, unspecified: Secondary | ICD-10-CM | POA: Diagnosis not present

## 2018-08-12 DIAGNOSIS — D500 Iron deficiency anemia secondary to blood loss (chronic): Secondary | ICD-10-CM | POA: Diagnosis not present

## 2018-08-18 DIAGNOSIS — E611 Iron deficiency: Secondary | ICD-10-CM | POA: Diagnosis not present

## 2018-08-18 DIAGNOSIS — E349 Endocrine disorder, unspecified: Secondary | ICD-10-CM | POA: Diagnosis not present

## 2018-08-27 DIAGNOSIS — E349 Endocrine disorder, unspecified: Secondary | ICD-10-CM | POA: Diagnosis not present

## 2018-08-27 DIAGNOSIS — D500 Iron deficiency anemia secondary to blood loss (chronic): Secondary | ICD-10-CM | POA: Diagnosis not present

## 2018-08-27 DIAGNOSIS — D72821 Monocytosis (symptomatic): Secondary | ICD-10-CM | POA: Diagnosis not present

## 2018-08-27 DIAGNOSIS — D751 Secondary polycythemia: Secondary | ICD-10-CM | POA: Diagnosis not present

## 2018-08-27 DIAGNOSIS — I10 Essential (primary) hypertension: Secondary | ICD-10-CM | POA: Diagnosis not present

## 2018-09-05 DIAGNOSIS — D500 Iron deficiency anemia secondary to blood loss (chronic): Secondary | ICD-10-CM | POA: Diagnosis not present

## 2018-09-12 DIAGNOSIS — D500 Iron deficiency anemia secondary to blood loss (chronic): Secondary | ICD-10-CM | POA: Diagnosis not present

## 2018-10-08 DIAGNOSIS — M9903 Segmental and somatic dysfunction of lumbar region: Secondary | ICD-10-CM | POA: Diagnosis not present

## 2018-10-08 DIAGNOSIS — M9905 Segmental and somatic dysfunction of pelvic region: Secondary | ICD-10-CM | POA: Diagnosis not present

## 2018-10-08 DIAGNOSIS — M461 Sacroiliitis, not elsewhere classified: Secondary | ICD-10-CM | POA: Diagnosis not present

## 2018-10-08 DIAGNOSIS — M5442 Lumbago with sciatica, left side: Secondary | ICD-10-CM | POA: Diagnosis not present

## 2018-10-14 DIAGNOSIS — M461 Sacroiliitis, not elsewhere classified: Secondary | ICD-10-CM | POA: Diagnosis not present

## 2018-10-14 DIAGNOSIS — M5442 Lumbago with sciatica, left side: Secondary | ICD-10-CM | POA: Diagnosis not present

## 2018-10-14 DIAGNOSIS — M9905 Segmental and somatic dysfunction of pelvic region: Secondary | ICD-10-CM | POA: Diagnosis not present

## 2018-10-14 DIAGNOSIS — M9903 Segmental and somatic dysfunction of lumbar region: Secondary | ICD-10-CM | POA: Diagnosis not present

## 2018-10-15 DIAGNOSIS — E785 Hyperlipidemia, unspecified: Secondary | ICD-10-CM | POA: Diagnosis not present

## 2018-10-15 DIAGNOSIS — I10 Essential (primary) hypertension: Secondary | ICD-10-CM | POA: Diagnosis not present

## 2018-10-15 DIAGNOSIS — E349 Endocrine disorder, unspecified: Secondary | ICD-10-CM | POA: Diagnosis not present

## 2018-10-15 DIAGNOSIS — D500 Iron deficiency anemia secondary to blood loss (chronic): Secondary | ICD-10-CM | POA: Diagnosis not present

## 2018-10-15 DIAGNOSIS — M461 Sacroiliitis, not elsewhere classified: Secondary | ICD-10-CM | POA: Diagnosis not present

## 2018-10-15 DIAGNOSIS — M5442 Lumbago with sciatica, left side: Secondary | ICD-10-CM | POA: Diagnosis not present

## 2018-10-15 DIAGNOSIS — M9903 Segmental and somatic dysfunction of lumbar region: Secondary | ICD-10-CM | POA: Diagnosis not present

## 2018-10-15 DIAGNOSIS — M9905 Segmental and somatic dysfunction of pelvic region: Secondary | ICD-10-CM | POA: Diagnosis not present

## 2018-10-15 DIAGNOSIS — R350 Frequency of micturition: Secondary | ICD-10-CM | POA: Diagnosis not present

## 2018-10-25 DIAGNOSIS — Z20828 Contact with and (suspected) exposure to other viral communicable diseases: Secondary | ICD-10-CM | POA: Diagnosis not present

## 2018-11-27 DIAGNOSIS — R1310 Dysphagia, unspecified: Secondary | ICD-10-CM | POA: Diagnosis not present

## 2018-11-27 DIAGNOSIS — K52839 Microscopic colitis, unspecified: Secondary | ICD-10-CM | POA: Diagnosis not present

## 2018-11-27 DIAGNOSIS — I10 Essential (primary) hypertension: Secondary | ICD-10-CM | POA: Diagnosis not present

## 2018-11-27 DIAGNOSIS — K219 Gastro-esophageal reflux disease without esophagitis: Secondary | ICD-10-CM | POA: Diagnosis not present

## 2018-12-01 ENCOUNTER — Other Ambulatory Visit: Payer: Self-pay

## 2018-12-03 DIAGNOSIS — Z23 Encounter for immunization: Secondary | ICD-10-CM | POA: Diagnosis not present

## 2018-12-05 DIAGNOSIS — D500 Iron deficiency anemia secondary to blood loss (chronic): Secondary | ICD-10-CM | POA: Diagnosis not present

## 2019-01-15 DIAGNOSIS — K130 Diseases of lips: Secondary | ICD-10-CM | POA: Diagnosis not present

## 2019-01-15 DIAGNOSIS — K146 Glossodynia: Secondary | ICD-10-CM | POA: Diagnosis not present

## 2019-01-26 DIAGNOSIS — Z23 Encounter for immunization: Secondary | ICD-10-CM | POA: Diagnosis not present

## 2019-01-28 DIAGNOSIS — M25531 Pain in right wrist: Secondary | ICD-10-CM | POA: Diagnosis not present

## 2019-02-05 DIAGNOSIS — K1321 Leukoplakia of oral mucosa, including tongue: Secondary | ICD-10-CM | POA: Diagnosis not present

## 2019-02-10 DIAGNOSIS — E349 Endocrine disorder, unspecified: Secondary | ICD-10-CM | POA: Diagnosis not present

## 2019-02-10 DIAGNOSIS — D751 Secondary polycythemia: Secondary | ICD-10-CM | POA: Diagnosis not present

## 2019-02-11 ENCOUNTER — Other Ambulatory Visit: Payer: Self-pay | Admitting: Orthopaedic Surgery

## 2019-02-11 DIAGNOSIS — M79601 Pain in right arm: Secondary | ICD-10-CM

## 2019-02-20 DIAGNOSIS — D500 Iron deficiency anemia secondary to blood loss (chronic): Secondary | ICD-10-CM | POA: Diagnosis not present

## 2019-02-20 DIAGNOSIS — E349 Endocrine disorder, unspecified: Secondary | ICD-10-CM | POA: Diagnosis not present

## 2019-02-27 DIAGNOSIS — I10 Essential (primary) hypertension: Secondary | ICD-10-CM | POA: Diagnosis not present

## 2019-02-27 DIAGNOSIS — M25531 Pain in right wrist: Secondary | ICD-10-CM | POA: Diagnosis not present

## 2019-02-27 DIAGNOSIS — L989 Disorder of the skin and subcutaneous tissue, unspecified: Secondary | ICD-10-CM | POA: Diagnosis not present

## 2019-02-28 ENCOUNTER — Other Ambulatory Visit: Payer: Self-pay

## 2019-02-28 ENCOUNTER — Ambulatory Visit
Admission: RE | Admit: 2019-02-28 | Discharge: 2019-02-28 | Disposition: A | Discharge status: Home / Self Care | Payer: TRICARE For Life (TFL) | Source: Ambulatory Visit | Attending: Orthopaedic Surgery | Admitting: Orthopaedic Surgery

## 2019-02-28 DIAGNOSIS — M79601 Pain in right arm: Secondary | ICD-10-CM

## 2019-02-28 DIAGNOSIS — M7989 Other specified soft tissue disorders: Secondary | ICD-10-CM | POA: Diagnosis not present

## 2019-02-28 IMAGING — MR MR FOREARM*R* W/O CM
7 series · 40 of 40 positions shown · non-contrast
Comparison: None.

CLINICAL DATA: Soft tissue swelling over the distal ulnar aspect of
the forearm and wrist.

EXAM:
MRI OF THE RIGHT FOREARM WITHOUT CONTRAST
TECHNIQUE: Multiplanar, multisequence MR imaging of the right upper extremity
was performed. No intravenous contrast was administered.

[Series 5: T2 fat-sat · axial · right · 5.0mm · 0.41mm/px · z∈[-136,+159]mm · 10 of 56 slices shown (1 of 2)]
[im 1/56]
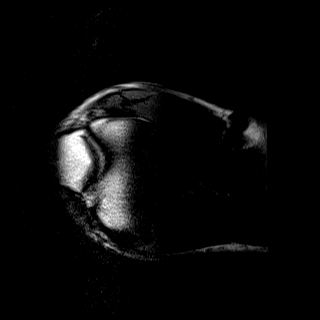
[im 7/56]
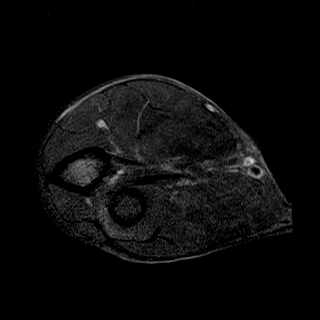
[im 13/56]
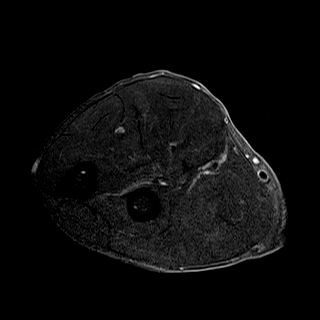
[im 19/56]
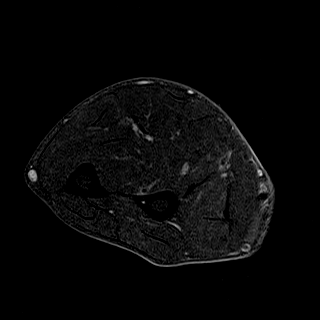
[im 25/56]
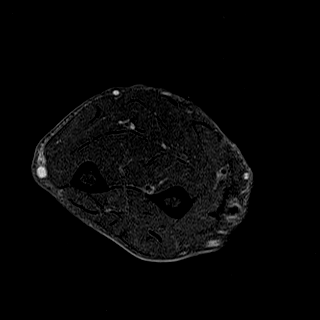
[im 31/56]
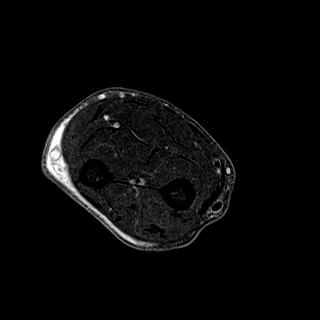
[im 37/56]
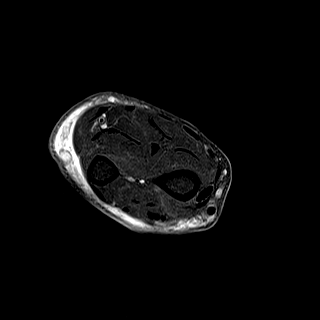
[im 43/56]
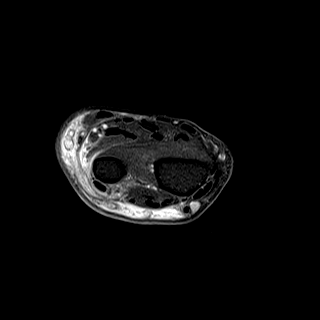
[im 49/56]
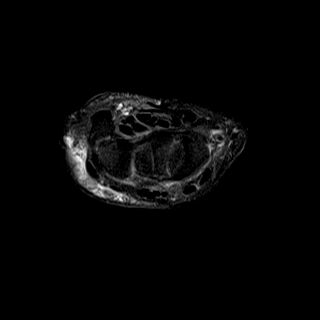
[im 56/56]
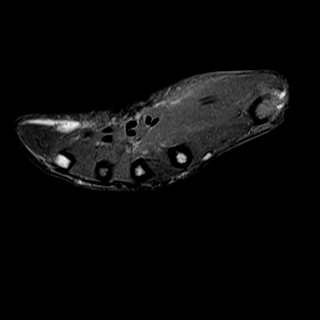

[Series 6: T1 · axial · right · 5.0mm · 0.41mm/px · z∈[-136,+159]mm · 10 of 56 slices shown (1 of 3)]
[im 1/56]
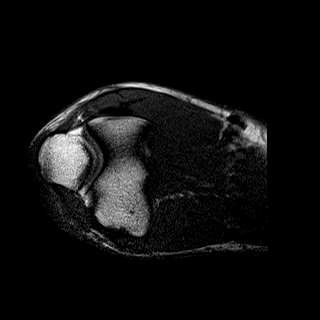
[im 7/56]
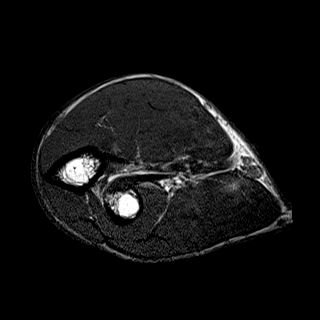
[im 13/56]
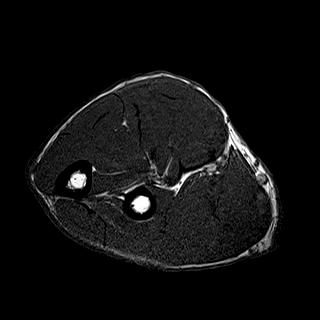
[im 19/56]
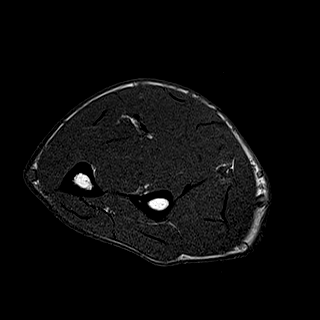
[im 25/56]
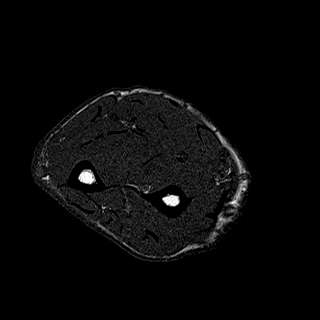
[im 31/56]
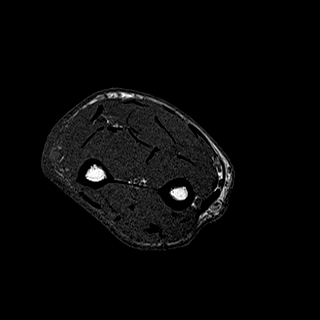
[im 37/56]
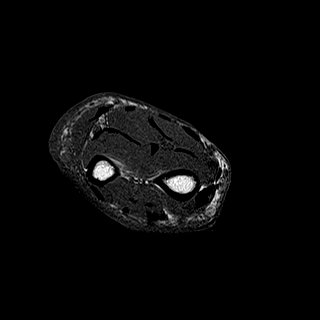
[im 43/56]
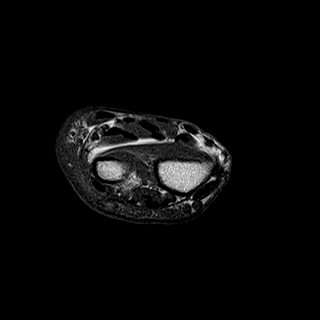
[im 49/56]
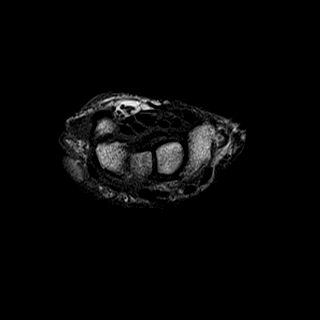
[im 56/56]
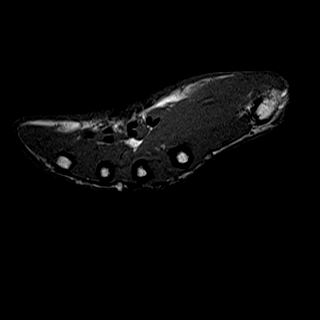

[Series 7: T1 · coronal · right · 4.0mm · 0.96mm/px · 4 of 20 slices shown (2 of 3)]
[im 1/20]
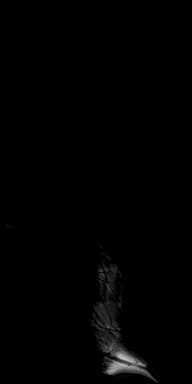
[im 7/20]
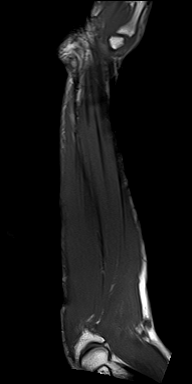
[im 13/20]
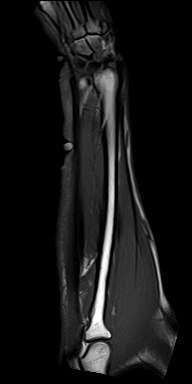
[im 20/20]
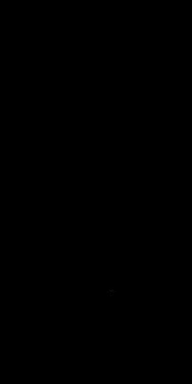

[Series 8: STIR · coronal · right · 4.0mm · 1.16mm/px · 4 of 20 slices shown (1 of 2)]
[im 1/20]
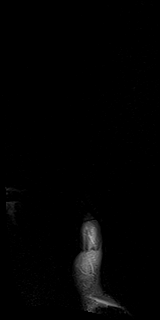
[im 7/20]
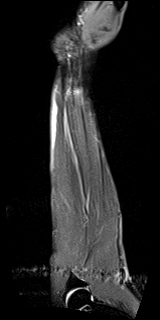
[im 13/20]
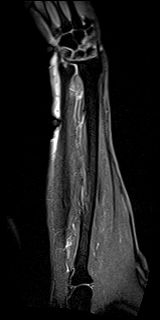
[im 20/20]
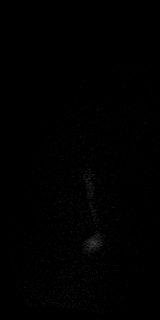

[Series 9: T1 · sagittal · right · 4.0mm · 0.83mm/px · 4 of 23 slices shown (3 of 3)]
[im 1/23]
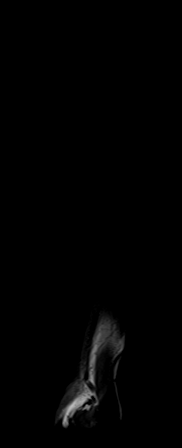
[im 8/23]
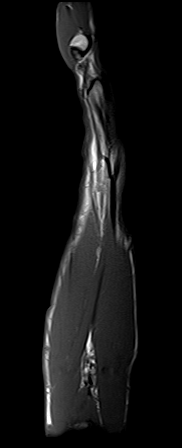
[im 15/23]
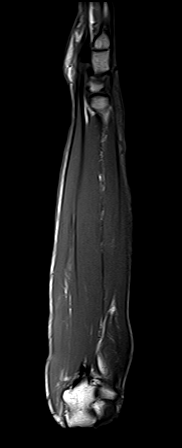
[im 23/23]
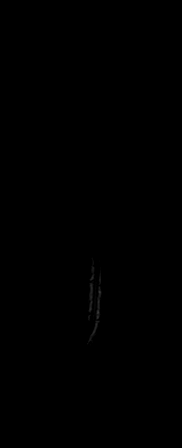

[Series 10: T2 fat-sat · sagittal · right · 4.0mm · 0.96mm/px · 4 of 23 slices shown (2 of 2)]
[im 1/23]
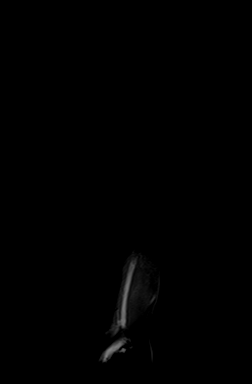
[im 8/23]
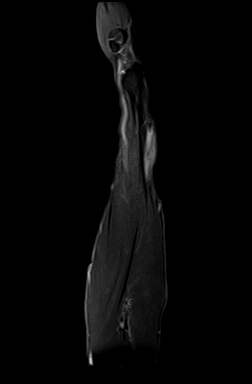
[im 15/23]
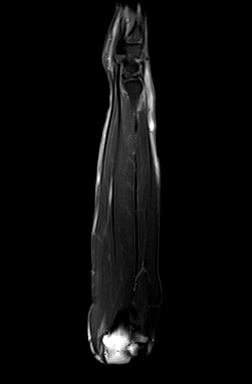
[im 23/23]
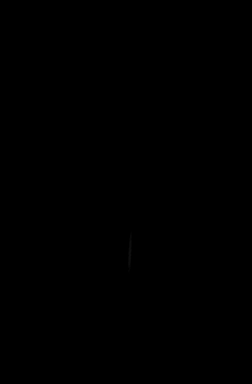

[Series 11: STIR · sagittal · right · 4.0mm · 1.16mm/px · 4 of 22 slices shown (2 of 2)]
[im 1/22]
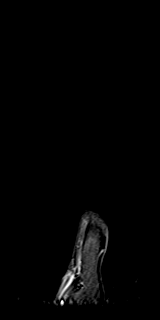
[im 8/22]
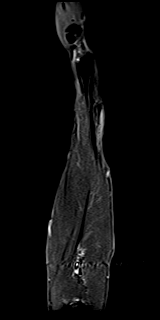
[im 15/22]
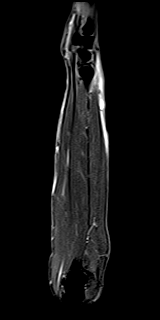
[im 22/22]
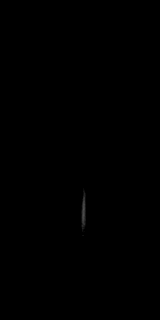

[40 of 40 positions shown; findings below may reference images not displayed]

FINDINGS: The radius and ulna are unremarkable. No bone lesions or fractures.
The radiocarpal and intercarpal joint spaces are maintained. No
significant degenerative or erosive findings.

The visualized portion of the elbow joint also appears normal.
Minimal degenerative changes but no erosive findings or joint
effusion.

The forearm musculature appears normal. No muscle mass, myositis or
tear.

Nonspecific subcutaneous soft tissue swelling/edema/fluid mainly
around the dorsal and ulnar aspect of the distal forearm and wrist.
I do not see a discrete mass or mass effect. This is all superficial
to the superficial muscle fascia. Mild associated skin thickening.
This may be some type of focal cellulitis. Some type of infiltrating
neoplastic process such as lymphoma would be remote possibility.
Superficial vein appears enlarged and may suggest superficial
thrombophlebitis.

The dorsal and volar wrist and forearm tendons are intact. No
tendinopathy or tenosynovitis. The carpal tunnel structures appear
normal.
IMPRESSION: 1. Nonspecific subcutaneous soft tissue swelling/edema/fluid and
skin thickening mainly along the ulnar and dorsal aspect of the
distal forearm and wrist. This could be some type of focal
cellulitis.
2. Suspect associated thrombophlebitis involving a superficial
ulnar-sided vein.
3. No significant findings involving the underlying forearm and
wrist musculature. No myofasciitis or pyomyositis.
4. No MR findings suspicious for septic arthritis or osteomyelitis.
5. If symptoms persist worsen contrast may be helpful to see if this
enhances.

## 2019-03-04 DIAGNOSIS — K52839 Microscopic colitis, unspecified: Secondary | ICD-10-CM | POA: Diagnosis not present

## 2019-03-04 DIAGNOSIS — M25531 Pain in right wrist: Secondary | ICD-10-CM | POA: Diagnosis not present

## 2019-03-04 DIAGNOSIS — I10 Essential (primary) hypertension: Secondary | ICD-10-CM | POA: Diagnosis not present

## 2019-03-04 DIAGNOSIS — K219 Gastro-esophageal reflux disease without esophagitis: Secondary | ICD-10-CM | POA: Diagnosis not present

## 2019-03-09 DIAGNOSIS — D500 Iron deficiency anemia secondary to blood loss (chronic): Secondary | ICD-10-CM | POA: Diagnosis not present

## 2019-03-24 DIAGNOSIS — L570 Actinic keratosis: Secondary | ICD-10-CM | POA: Diagnosis not present

## 2019-03-24 DIAGNOSIS — L820 Inflamed seborrheic keratosis: Secondary | ICD-10-CM | POA: Diagnosis not present

## 2019-03-24 DIAGNOSIS — L03113 Cellulitis of right upper limb: Secondary | ICD-10-CM | POA: Diagnosis not present

## 2019-05-29 ENCOUNTER — Ambulatory Visit: Payer: Medicare Other

## 2019-06-06 ENCOUNTER — Ambulatory Visit: Payer: TRICARE For Life (TFL) | Attending: Internal Medicine

## 2019-06-06 DIAGNOSIS — Z23 Encounter for immunization: Secondary | ICD-10-CM | POA: Insufficient documentation

## 2019-06-06 NOTE — Progress Notes (Signed)
   Covid-19 Vaccination Clinic  Name:  Steven [name]    MRN: [id number] DOB: June 29, 1950  06/06/2019  Mr. Steven [name] was observed post Covid-19 immunization for 15 minutes without incidence. He was provided with Vaccine Information Sheet and instruction to access the V-Safe system.   Mr. Steven [name] was instructed to call 911 with any severe reactions post vaccine: Marland Kitchen Difficulty breathing  . Swelling of your face and throat  . A fast heartbeat  . A bad rash all over your body  . Dizziness and weakness    Immunizations Administered    Name Date Dose VIS Date Route   Pfizer COVID-19 Vaccine 06/06/2019  5:03 PM 0.3 mL 04/10/2019 Intramuscular   Manufacturer: [employer], [employer]   Lot: [id number]   NDC: [id number]

## 2019-06-15 ENCOUNTER — Ambulatory Visit: Payer: Medicare Other

## 2019-07-01 ENCOUNTER — Ambulatory Visit: Payer: TRICARE For Life (TFL) | Attending: Internal Medicine

## 2019-07-01 DIAGNOSIS — Z23 Encounter for immunization: Secondary | ICD-10-CM | POA: Insufficient documentation

## 2019-07-01 NOTE — Progress Notes (Signed)
   Covid-19 Vaccination Clinic  Name:  Steven [name]    MRN: [id number] DOB: 09-Mar-1951  07/01/2019  Mr. Steven [name] was observed post Covid-19 immunization for 15 minutes without incident. He was provided with Vaccine Information Sheet and instruction to access the V-Safe system.   Mr. Steven [name] was instructed to call 911 with any severe reactions post vaccine: Marland Kitchen Difficulty breathing  . Swelling of face and throat  . A fast heartbeat  . A bad rash all over body  . Dizziness and weakness   Immunizations Administered    Name Date Dose VIS Date Route   Pfizer COVID-19 Vaccine 07/01/2019  1:23 PM 0.3 mL 04/10/2019 Intramuscular   Manufacturer: [employer], [employer]   Lot: [id number]   NDC: [address]-2

## 2019-07-15 ENCOUNTER — Telehealth (HOSPITAL_COMMUNITY): Payer: Self-pay

## 2019-07-15 ENCOUNTER — Other Ambulatory Visit: Payer: Self-pay | Admitting: *Deleted

## 2019-07-15 DIAGNOSIS — I728 Aneurysm of other specified arteries: Secondary | ICD-10-CM

## 2019-07-15 NOTE — Telephone Encounter (Signed)

## 2019-07-16 ENCOUNTER — Encounter: Payer: Self-pay | Admitting: Vascular Surgery

## 2019-07-16 ENCOUNTER — Ambulatory Visit (HOSPITAL_COMMUNITY)
Admission: RE | Admit: 2019-07-16 | Discharge: 2019-07-16 | Disposition: A | Discharge status: Home / Self Care | Payer: Medicare Other | Source: Ambulatory Visit | Attending: Vascular Surgery | Admitting: Vascular Surgery

## 2019-07-16 ENCOUNTER — Other Ambulatory Visit: Payer: Self-pay

## 2019-07-16 ENCOUNTER — Ambulatory Visit (INDEPENDENT_AMBULATORY_CARE_PROVIDER_SITE_OTHER): Payer: Medicare Other | Admitting: Vascular Surgery

## 2019-07-16 ENCOUNTER — Inpatient Hospital Stay (HOSPITAL_COMMUNITY): Admission: RE | Admit: 2019-07-16 | Payer: TRICARE For Life (TFL) | Source: Ambulatory Visit

## 2019-07-16 ENCOUNTER — Encounter (HOSPITAL_COMMUNITY): Payer: Self-pay

## 2019-07-16 VITALS — BP 128/80 | HR 60 | Temp 98.2°F | Resp 20 | Ht 70.0 in | Wt 190.0 lb

## 2019-07-16 DIAGNOSIS — I728 Aneurysm of other specified arteries: Secondary | ICD-10-CM

## 2019-07-16 NOTE — Progress Notes (Signed)
Patient is a 69 year old male who returns for follow-up today.  We have been seeing him for a celiac artery aneurysm since 2018.  At that point the aneurysm diameter was 1.6 cm.  He has no prior history of abdominal aortic aneurysm on multiple prior ultrasounds.  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  His son has taken over most of the daily operations.  He still stays very active and rides his bike frequently.  Past Medical History:  Diagnosis Date  . GERD with stricture   . Hyperlipidemia   . Hypertension   . Situational stress     Past Surgical History:  Procedure Laterality Date  . cardiolite stresstest  fall 2010  . COLONOSCOPY  fall 2010  . UPPER GASTROINTESTINAL ENDOSCOPY  fall 2010    Current Outpatient Medications on File Prior to Visit  Medication Sig Dispense Refill  . budesonide (ENTOCORT EC) 3 MG 24 hr capsule TK 3 CS PO QAM 270 capsule 3  . Clindamycin-Benzoyl Per, Refr, gel     . Coenzyme Q10 60 MG CAPS Take by mouth.    Marland Kitchen ibuprofen (ADVIL,MOTRIN) 800 MG tablet TAKE 1 TABLET BY MOUTH EVERY 8 HOURS AS NEEDED 30 tablet 2  . lisinopril (PRINIVIL,ZESTRIL) 20 MG tablet TAKE 1 TABLET DAILY 90 tablet 4  . losartan (COZAAR) 100 MG tablet TAKE 1 TABLET DAILY 90 tablet 4  . Multiple Vitamin (MULTIVITAMIN) tablet Take 1 tablet by mouth daily.    Marland Kitchen NIASPAN 1000 MG CR tablet TAKE 1 TABLET AT BEDTIME 90 tablet 2  . Omega-3 Fatty Acids (FISH OIL) 1000 MG CAPS Take by mouth.    Marland Kitchen omeprazole (PRILOSEC) 40 MG capsule TAKE 1 CAPSULE BY MOUTH EVERY DAY 90 capsule 2  . PICATO 0.015 % GEL     . sertraline (ZOLOFT) 50 MG tablet Take 1 tablet (50 mg total) by mouth daily. 90 tablet 2  . simvastatin (ZOCOR) 40 MG tablet TAKE 1 TABLET EVERY EVENING 90 tablet 2  . testosterone cypionate (DEPOTESTOSTERONE CYPIONATE) 200 MG/ML injection INJECT 1.5 MLS IN THE MUSCLE EVERY 14 DAYS AS DIRECTED 10 mL 0  . triazolam (HALCION) 0.25 MG tablet Take 1 tablet  (0.25 mg total) by mouth at bedtime as needed. 90 tablet 2   No current facility-administered medications on file prior to visit.     Review of systems: He has no shortness of breath.  He has no chest pain.  Physical exam:  Vitals:   07/16/19 1104  BP: 128/80  Pulse: 60  Resp: 20  Temp: 98.2 F (36.8 C)  SpO2: 100%  Weight: 190 lb (86.2 kg)  Height: 5\' 10"  (1.778 m)   Abdomen: Soft nontender nondistended no mass  Extremities: 2+ femoral pulses 2+ posterior tibial pulses absent dorsalis pedis pulses  Data: Patient had a duplex ultrasound of the abdomen today.  This shows the abdominal aorta was 2.3 cm diameter no evidence of aneurysm.  Celiac artery was 1.6 cm in diameter unchanged from 2018.  Assessment: Celiac artery aneurysm 1.6 cm diameter unchanged over the last 3 years.  Would consider repair if greater than 2 cm.  Plan: Patient will have a follow-up ultrasound in 2 years.  He will call me sooner if he has any problems.  2019, MD [facility] [place] Office: [phone]

## 2019-07-17 ENCOUNTER — Other Ambulatory Visit: Payer: Self-pay | Admitting: *Deleted

## 2019-07-17 DIAGNOSIS — I728 Aneurysm of other specified arteries: Secondary | ICD-10-CM

## 2020-01-08 ENCOUNTER — Other Ambulatory Visit: Payer: Self-pay | Admitting: Physical Medicine and Rehabilitation

## 2020-01-08 DIAGNOSIS — M75102 Unspecified rotator cuff tear or rupture of left shoulder, not specified as traumatic: Secondary | ICD-10-CM

## 2020-01-30 ENCOUNTER — Other Ambulatory Visit: Payer: Medicare Other

## 2020-02-06 ENCOUNTER — Ambulatory Visit
Admission: RE | Admit: 2020-02-06 | Discharge: 2020-02-06 | Disposition: A | Discharge status: Home / Self Care | Payer: TRICARE For Life (TFL) | Source: Ambulatory Visit | Attending: Physical Medicine and Rehabilitation | Admitting: Physical Medicine and Rehabilitation

## 2020-02-06 DIAGNOSIS — M75102 Unspecified rotator cuff tear or rupture of left shoulder, not specified as traumatic: Secondary | ICD-10-CM

## 2020-02-06 IMAGING — MR MR SHOULDER*L* W/O CM
5 series · 40 of 40 positions shown · non-contrast
Comparison: None.

CLINICAL DATA: Chronic left shoulder pain weakness over the last
4-5 years.

EXAM:
MRI OF THE LEFT SHOULDER WITHOUT CONTRAST
TECHNIQUE: Multiplanar, multisequence MR imaging of the shoulder was performed.
No intravenous contrast was administered.

[Series 5: T2 fat-sat · sagittal · left · 3.0mm · 0.44mm/px · 9 of 21 slices shown (1 of 3)]
[im 1/21]
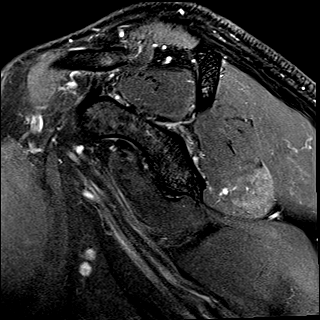
[im 3/21]
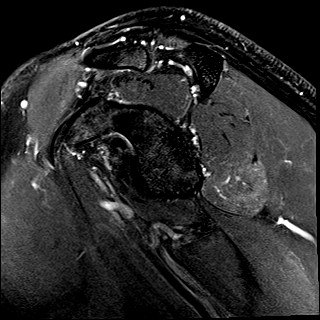
[im 6/21]
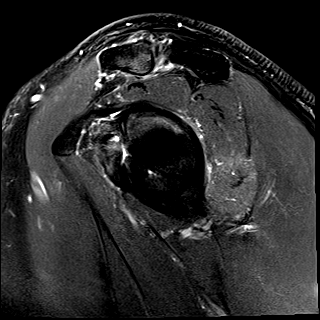
[im 8/21]
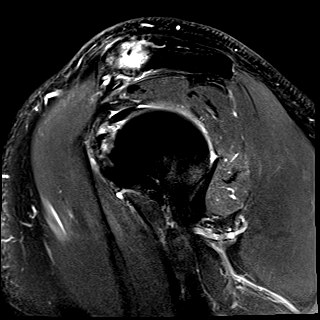
[im 11/21]
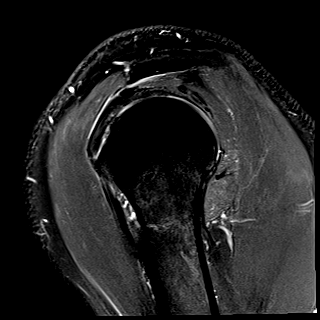
[im 13/21]
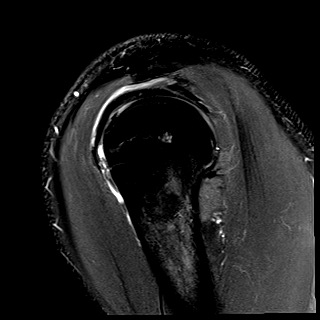
[im 16/21]
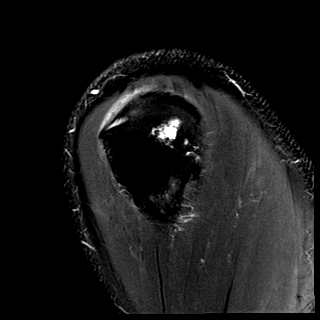
[im 18/21]
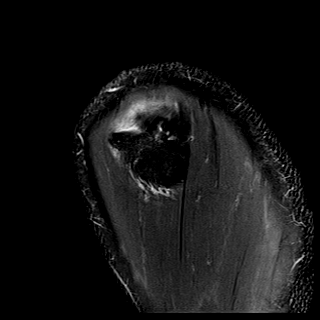
[im 21/21]
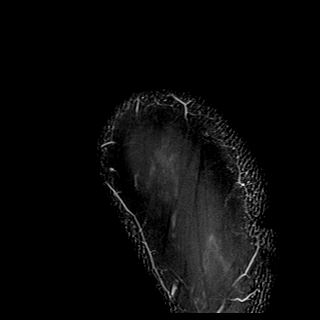

[Series 6: T2 fat-sat · axial · left · 3.0mm · 0.44mm/px · z∈[-64,+21]mm · 9 of 23 slices shown (2 of 3)]
[im 1/23]
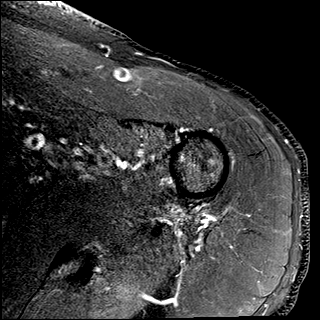
[im 3/23]
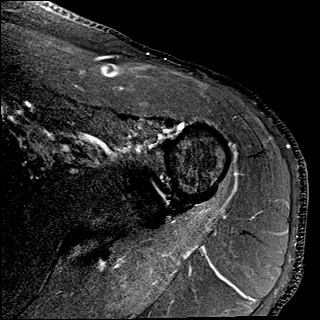
[im 6/23]
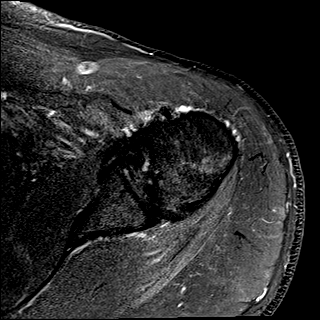
[im 9/23]
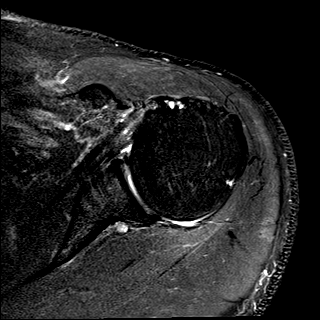
[im 12/23]
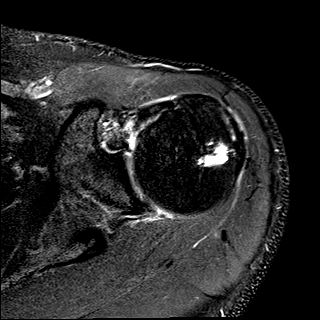
[im 14/23]
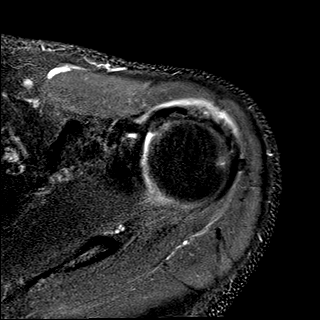
[im 17/23]
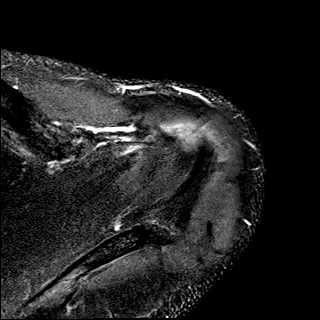
[im 20/23]
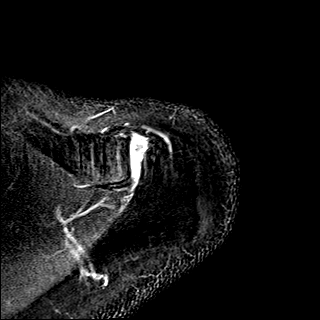
[im 23/23]
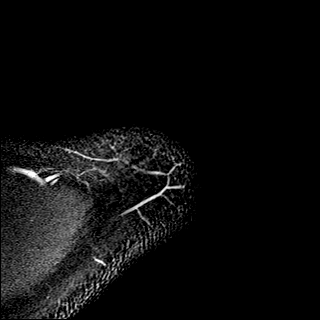

[Series 7: T1 · sagittal · left · 3.0mm · 0.36mm/px · 8 of 21 slices shown]
[im 1/21]
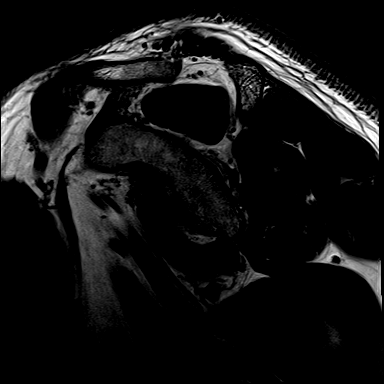
[im 3/21]
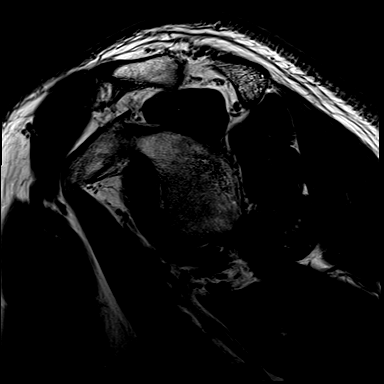
[im 6/21]
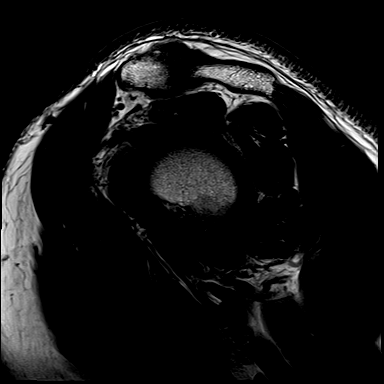
[im 9/21]
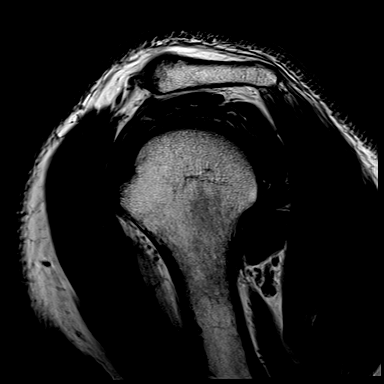
[im 12/21]
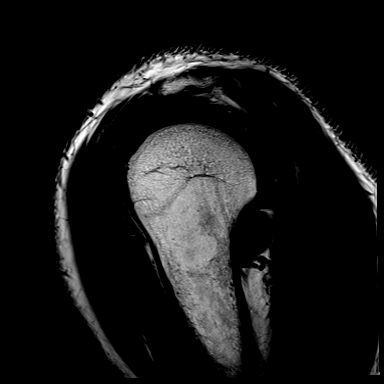
[im 15/21]
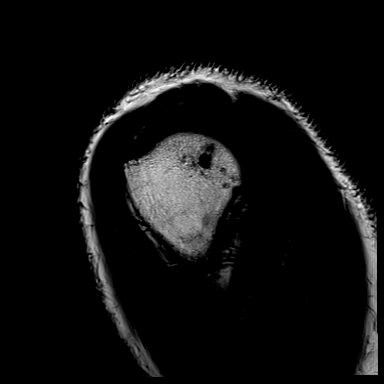
[im 18/21]
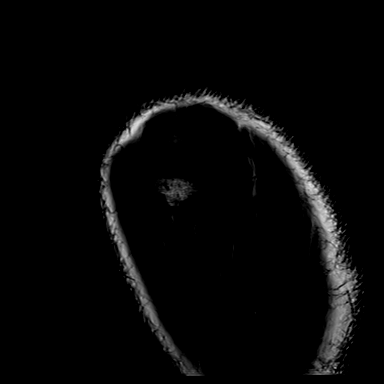
[im 21/21]
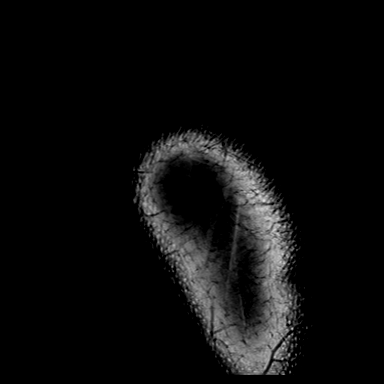

[Series 8: PD fat-sat · coronal · left · 3.0mm · 0.44mm/px · 7 of 19 slices shown]
[im 1/19]
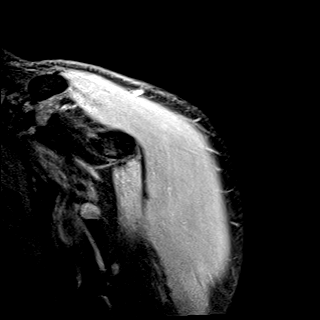
[im 4/19]
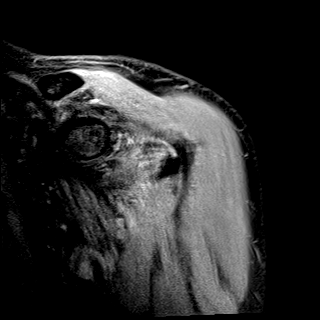
[im 7/19]
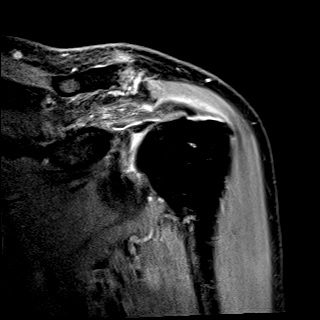
[im 10/19]
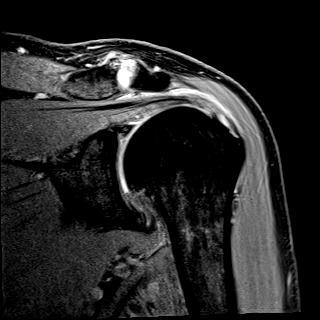
[im 13/19]
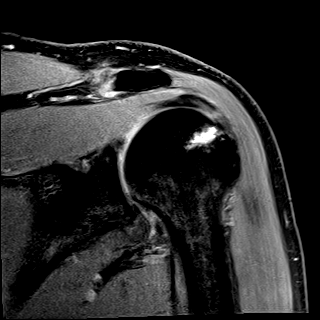
[im 16/19]
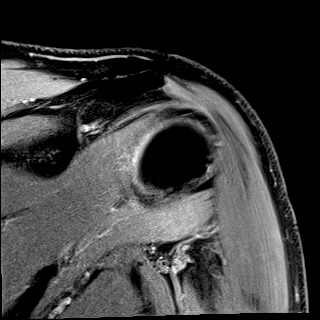
[im 19/19]
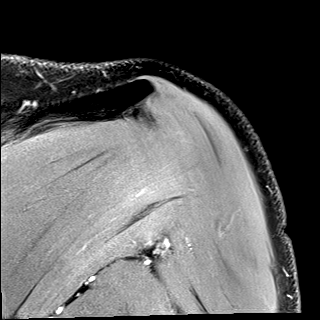

[Series 9: T2 fat-sat · coronal · left · 3.0mm · 0.44mm/px · 7 of 19 slices shown (3 of 3)]
[im 1/19]
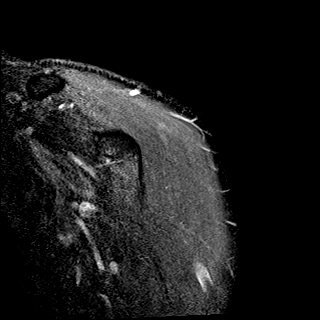
[im 4/19]
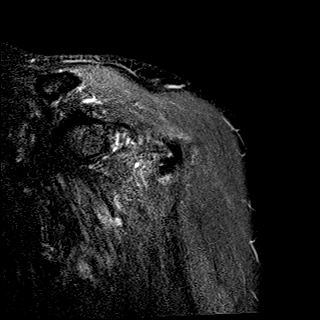
[im 7/19]
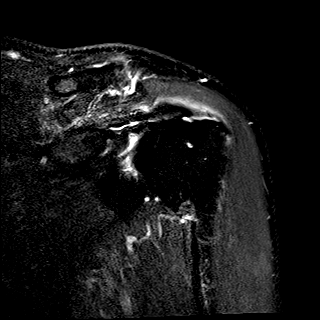
[im 10/19]
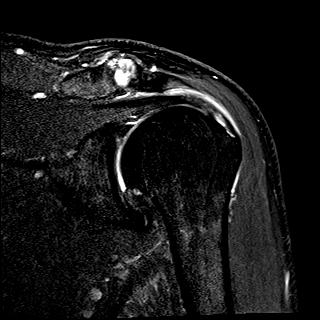
[im 13/19]
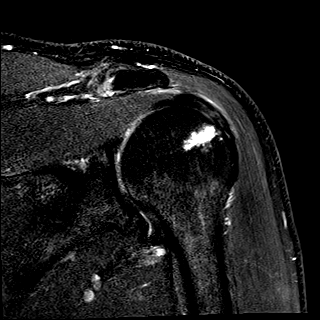
[im 16/19]
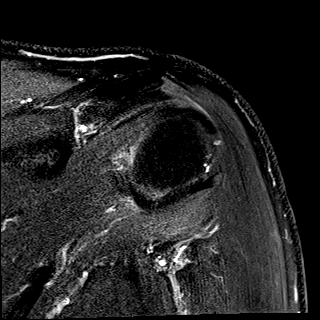
[im 19/19]
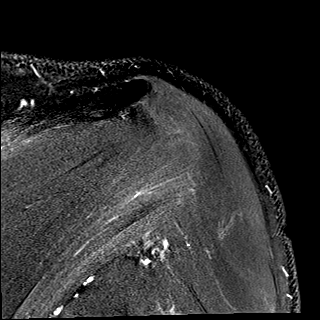

[40 of 40 positions shown; findings below may reference images not displayed]

FINDINGS: Rotator cuff: High-grade partial-thickness articular surface tearing
of the supraspinatus tendon distally. No definite full-thickness
extension is identified although admittedly the anterior
supraspinatus tendon is particularly attenuated. Moderate
supraspinatus and subscapularis tendinopathy.

Muscles: Mild abnormal edema signal in the teres minor muscle
suggesting quadrilateral space syndrome.

Biceps long head:  Unremarkable

Acromioclavicular Joint: Mild spurring and moderate fluid signal in
the joint with mild subcortical marrow edema, overall compatible
with mild degenerative AC joint arthropathy. Type I acromion. Trace
subacromial subdeltoid bursitis.

Glenohumeral Joint: Indistinct coracohumeral ligament with suspected
mild synovitis along the rotator interval, which can be associated
with adhesive capsulitis. Minimal spurring of the humeral head.

Labrum:  Grossly unremarkable

Bones: Degenerative subcortical cystic lesion posterolaterally along
the humeral head.

Other: No supplemental non-categorized findings.
IMPRESSION: 1. High-grade partial-thickness articular surface tearing of the
supraspinatus tendon distally. No definite full-thickness extension
identified although admittedly the anterior supraspinatus tendon is
particularly attenuated.
2. Moderate supraspinatus and subscapularis tendinopathy.
3. Mild abnormal edema signal in the teres minor muscle suggesting
quadrilateral space syndrome.
4. Mild degenerative AC joint arthropathy and mild degenerative
glenohumeral arthropathy.
5. Trace subacromial subdeltoid bursitis.
6. Indistinct coracohumeral ligament with suspected mild synovitis
along the rotator interval, which can be associated with adhesive
capsulitis.

## 2021-07-17 ENCOUNTER — Other Ambulatory Visit: Payer: Self-pay

## 2021-07-17 ENCOUNTER — Ambulatory Visit (HOSPITAL_COMMUNITY)
Admission: RE | Admit: 2021-07-17 | Discharge: 2021-07-17 | Disposition: A | Discharge status: Home / Self Care | Payer: Medicare Other | Source: Ambulatory Visit | Attending: Surgery | Admitting: Surgery

## 2021-07-17 ENCOUNTER — Encounter: Payer: Self-pay | Admitting: Surgery

## 2021-07-17 ENCOUNTER — Ambulatory Visit (INDEPENDENT_AMBULATORY_CARE_PROVIDER_SITE_OTHER): Payer: Medicare Other | Admitting: Surgery

## 2021-07-17 VITALS — BP 146/89 | HR 59 | Temp 98.6°F | Resp 20 | Ht 70.0 in | Wt 191.0 lb

## 2021-07-17 DIAGNOSIS — I728 Aneurysm of other specified arteries: Secondary | ICD-10-CM | POA: Diagnosis present

## 2021-07-17 NOTE — Progress Notes (Signed)
? ?[facility] ? ?Patient name: Steven [name] MRN: [id number] DOB: [birth year] Sex: male ? ? ?REASON FOR VISIT:  ? ? ?Follow up ? ?HISOTRY OF PRESENT ILLNESS:  ? ? ?Steven [name] is a 71 y.o. male who is a former patient of Dr. Darrick Penna who has been following him for celiac artery aneurysm since 2018.  At that point the diameter was 1.6 cm.  At his last visit in 2021 it remained stable at 1.6 cm.  He has no prior history of abdominal aortic aneurysm based multiple ultrasounds.  He remains very active.  He denies any abdominal pain. ? ?He is medically managed for hypertension with ACE inhibitor.  He takes a statin for hypercholesterolemia.  He is a non-smoker. ? ? ?PAST MEDICAL HISTORY:  ? ?Past Medical History:  ?Diagnosis Date  ? GERD with stricture   ? Hyperlipidemia   ? Hypertension   ? Situational stress   ? ? ? ?FAMILY HISTORY:  ? ?Family History  ?Problem Relation Age of Onset  ? Cancer Mother   ?     lung   ? Lymphoma Father   ? Coronary artery disease Sister   ? ? ?SOCIAL HISTORY:  ? ?Social History  ? ?Tobacco Use  ? Smoking status: Never  ? Smokeless tobacco: Never  ?Substance Use Topics  ? Alcohol use: Yes  ? ? ? ?ALLERGIES:  ? ?Allergies  ?Allergen Reactions  ? Penicillins Anaphylaxis  ? Sulfa Antibiotics Rash  ? ? ? ?CURRENT MEDICATIONS:  ? ?Current Outpatient Medications  ?Medication Sig Dispense Refill  ? Clindamycin-Benzoyl Per, Refr, gel     ? Coenzyme Q10 60 MG CAPS Take by mouth.    ? ibuprofen (ADVIL,MOTRIN) 800 MG tablet TAKE 1 TABLET BY MOUTH EVERY 8 HOURS AS NEEDED 30 tablet 2  ? lisinopril (PRINIVIL,ZESTRIL) 20 MG tablet TAKE 1 TABLET DAILY 90 tablet 4  ? losartan (COZAAR) 100 MG tablet TAKE 1 TABLET DAILY 90 tablet 4  ? Multiple Vitamin (MULTIVITAMIN) tablet Take 1 tablet by mouth daily.    ? omeprazole (PRILOSEC) 40 MG capsule TAKE 1 CAPSULE BY MOUTH EVERY DAY 90 capsule 2  ? sertraline (ZOLOFT) 50 MG tablet Take 1 tablet (50  mg total) by mouth daily. 90 tablet 2  ? simvastatin (ZOCOR) 40 MG tablet TAKE 1 TABLET EVERY EVENING 90 tablet 2  ? testosterone cypionate (DEPOTESTOSTERONE CYPIONATE) 200 MG/ML injection INJECT 1.5 MLS IN THE MUSCLE EVERY 14 DAYS AS DIRECTED 10 mL 0  ? triazolam (HALCION) 0.25 MG tablet Take 1 tablet (0.25 mg total) by mouth at bedtime as needed. 90 tablet 2  ? VASCEPA 1 g capsule     ? ?No current facility-administered medications for this visit.  ? ? ?REVIEW OF SYSTEMS:  ? ?[X]  denotes positive finding, [ ]  denotes negative finding ?Cardiac  Comments:  ?Chest pain or chest pressure:    ?Shortness of breath upon exertion:    ?Short of breath when lying flat:    ?Irregular heart rhythm:    ?    ?Vascular    ?Pain in calf, thigh, or hip brought on by ambulation:    ?Pain in feet at night that wakes you up from your sleep:     ?Blood clot in your veins:    ?Leg swelling:     ?    ?Pulmonary    ?Oxygen at home:    ?Productive cough:     ?Wheezing:     ?    ?  Neurologic    ?Sudden weakness in arms or legs:     ?Sudden numbness in arms or legs:     ?Sudden onset of difficulty speaking or slurred speech:    ?Temporary loss of vision in one eye:     ?Problems with dizziness:     ?    ?Gastrointestinal    ?Blood in stool:     ?Vomited blood:     ?    ?Genitourinary    ?Burning when urinating:     ?Blood in urine:    ?    ?Psychiatric    ?Major depression:     ?    ?Hematologic    ?Bleeding problems:    ?Problems with blood clotting too easily:    ?    ?Skin    ?Rashes or ulcers:    ?    ?Constitutional    ?Fever or chills:    ? ? ?PHYSICAL EXAM:  ? ?Vitals:  ? 07/17/21 0910  ?BP: (!) 146/89  ?Pulse: (!) 59  ?Resp: 20  ?Temp: 98.6 ?F (37 ?C)  ?SpO2: 95%  ?Weight: 191 lb (86.6 kg)  ?Height: 5\' 10"  (1.778 m)  ? ? ?GENERAL: The patient is a well-nourished male, in no acute distress. The vital signs are documented above. ?CARDIAC: There is a regular rate and rhythm.  ?PULMONARY: Non-labored respirations ?ABDOMEN: Soft and  non-tender  ?MUSCULOSKELETAL: There are no major deformities or cyanosis. ?NEUROLOGIC: No focal weakness or paresthesias are detected. ?SKIN: There are no ulcers or rashes noted. ?PSYCHIATRIC: The patient has a normal affect. ? ?STUDIES:  ? ?I have reviewed the following: ?Mesenteric:  ?Normal Superior Mesenteric artery findings.  ?Maximum celiac artery diameter 1.5 cm.  ? ?MEDICAL ISSUES:  ? ?Celiac aneurysm: Today's scan shows a stable 1.5 cm aneurysm.  He will return in 2 years for repeat ultrasound. ? ? ? ? , IV, MD, FACS ?[facility] ?Tel [phone] ?Pager [phone]  ?

## 2022-11-23 ENCOUNTER — Other Ambulatory Visit: Payer: Self-pay

## 2022-11-23 ENCOUNTER — Emergency Department (HOSPITAL_COMMUNITY)
Admission: EM | Admit: 2022-11-23 | Discharge: 2022-11-23 | Disposition: A | Discharge status: Home / Self Care | Payer: Medicare Other | Attending: Emergency Medicine | Admitting: Emergency Medicine

## 2022-11-23 ENCOUNTER — Emergency Department (HOSPITAL_COMMUNITY): Payer: TRICARE For Life (TFL)

## 2022-11-23 DIAGNOSIS — Y9301 Activity, walking, marching and hiking: Secondary | ICD-10-CM | POA: Diagnosis not present

## 2022-11-23 DIAGNOSIS — S01111A Laceration without foreign body of right eyelid and periocular area, initial encounter: Secondary | ICD-10-CM | POA: Diagnosis not present

## 2022-11-23 DIAGNOSIS — Z79899 Other long term (current) drug therapy: Secondary | ICD-10-CM | POA: Insufficient documentation

## 2022-11-23 DIAGNOSIS — Z23 Encounter for immunization: Secondary | ICD-10-CM | POA: Insufficient documentation

## 2022-11-23 DIAGNOSIS — S098XXA Other specified injuries of head, initial encounter: Secondary | ICD-10-CM | POA: Diagnosis present

## 2022-11-23 DIAGNOSIS — W01118A Fall on same level from slipping, tripping and stumbling with subsequent striking against other sharp object, initial encounter: Secondary | ICD-10-CM | POA: Diagnosis not present

## 2022-11-23 DIAGNOSIS — I10 Essential (primary) hypertension: Secondary | ICD-10-CM | POA: Diagnosis not present

## 2022-11-23 DIAGNOSIS — W19XXXA Unspecified fall, initial encounter: Secondary | ICD-10-CM

## 2022-11-23 MED ORDER — TETANUS-DIPHTH-ACELL PERTUSSIS 5-2.5-18.5 LF-MCG/0.5 IM SUSY
0.5000 mL | PREFILLED_SYRINGE | Freq: Once | INTRAMUSCULAR | Status: AC
  Administered 2022-11-23: 0.5 mL via INTRAMUSCULAR
  Filled 2022-11-23: qty 0.5

## 2022-11-23 MED ORDER — LIDOCAINE-EPINEPHRINE (PF) 2 %-1:200000 IJ SOLN
10.0000 mL | Freq: Once | INTRAMUSCULAR | Status: AC
  Administered 2022-11-23: 10 mL
  Filled 2022-11-23: qty 20

## 2022-11-23 MED ORDER — ACETAMINOPHEN 500 MG PO TABS
1000.0000 mg | ORAL_TABLET | Freq: Once | ORAL | Status: AC
  Administered 2022-11-23: 1000 mg via ORAL
  Filled 2022-11-23: qty 2

## 2022-11-23 NOTE — ED Triage Notes (Signed)
Pt reports tripping and falling, falling forward, hitting his head on a truck bumper. Bruising and x2 lacerations surrounding rt eye. Chief complaint is neck pain. Placed in collar in triage. Denies LOC or use of blood thinners.

## 2022-11-23 NOTE — Discharge Instructions (Signed)
Please follow-up with your primary care provider in 7 to 10 days to have your sutures removed.  You have 3 sutures placed.  You may take Tylenol every 6 hours needed for pain.  Today your imaging was reassuring and you most likely have a bruise causing your pain.  Please keep your wound clean and dry.  If symptoms change or worsen please return to ER.

## 2022-11-23 NOTE — ED Provider Notes (Signed)
EMERGENCY DEPARTMENT AT [facility] Provider Note   CSN: [id number] Arrival date & time: 11/23/22  1653     History  Chief Complaint  Patient presents with   Fall   Neck Pain    Steven [name] is a 72 y.o. male history of hypertension presented after mechanical fall 2 hours ago.  Patient states he was walking out of the house when he tripped over a metal object fell and hit his face on his truck bumper.  Patient denies any LOC, blood thinners but states that the right side of his head hurts.  He hit.  Patient notes that he was wearing sunglasses that broke and now he has a cut on his right eyebrow.  Patient denies any numbness or tingling, new onset weakness but does note that he has midline neck tenderness and was placed in c-collar.  Patient is unsure of last tetanus shot.  Home Medications Prior to Admission medications   Medication Sig Start Date End Date Taking? Authorizing Provider  Clindamycin-Benzoyl Per, Refr, gel  06/06/17   [provider]  Coenzyme Q10 60 MG CAPS Take by mouth.    [provider]  ibuprofen (ADVIL,MOTRIN) 800 MG tablet TAKE 1 TABLET BY MOUTH EVERY 8 HOURS AS NEEDED 03/13/16   Gordy Savers, MD  lisinopril (PRINIVIL,ZESTRIL) 20 MG tablet TAKE 1 TABLET DAILY 11/29/17   Gordy Savers, MD  losartan (COZAAR) 100 MG tablet TAKE 1 TABLET DAILY 03/07/18   Roderick Pee, MD  Multiple Vitamin (MULTIVITAMIN) tablet Take 1 tablet by mouth daily.    [provider]  omeprazole (PRILOSEC) 40 MG capsule TAKE 1 CAPSULE BY MOUTH EVERY DAY 09/27/16   Gordy Savers, MD  sertraline (ZOLOFT) 50 MG tablet Take 1 tablet (50 mg total) by mouth daily. 09/27/16   Gordy Savers, MD  simvastatin (ZOCOR) 40 MG tablet TAKE 1 TABLET EVERY EVENING 10/04/17   Gordy Savers, MD  testosterone cypionate (DEPOTESTOSTERONE CYPIONATE) 200 MG/ML injection INJECT 1.5 MLS IN THE MUSCLE EVERY 14 DAYS AS DIRECTED 06/28/17    [place], Betty G, MD  triazolam (HALCION) 0.25 MG tablet Take 1 tablet (0.25 mg total) by mouth at bedtime as needed. 09/12/17   Gordy Savers, MD  VASCEPA 1 g capsule  06/22/19   [provider]      Allergies    Penicillins and Sulfa antibiotics    Review of Systems   Review of Systems  Musculoskeletal:  Positive for neck pain.    Physical Exam Updated Vital Signs BP (!) 145/89 (BP Location: Left Arm)   Pulse 75   Temp 98.6 F (37 C) (Oral)   Resp 17   Ht 5\' 10"  (1.778 m)   Wt 86.2 kg   SpO2 93%   BMI 27.26 kg/m  Physical Exam Constitutional:      General: He is not in acute distress. HENT:     Head: Normocephalic.     Right Ear: Tympanic membrane, ear canal and external ear normal.     Left Ear: Tympanic membrane, ear canal and external ear normal.     Mouth/Throat:     Mouth: Mucous membranes are moist.  Eyes:     Extraocular Movements: Extraocular movements intact.     Conjunctiva/sclera: Conjunctivae normal.     Pupils: Pupils are equal, round, and reactive to light.  Neck:     Comments: In c-collar Midline cervical tenderness without abnormalities palpated Cardiovascular:  Rate and Rhythm: Normal rate and regular rhythm.     Pulses: Normal pulses.     Heart sounds: Normal heart sounds.  Pulmonary:     Effort: Pulmonary effort is normal. No respiratory distress.     Breath sounds: Normal breath sounds.  Abdominal:     Palpations: Abdomen is soft.     Tenderness: There is no abdominal tenderness. There is no guarding or rebound.  Musculoskeletal:        General: Normal range of motion.  Skin:    General: Skin is warm and dry.     Capillary Refill: Capillary refill takes less than 2 seconds.     Comments: 2 cm laceration noted to lateral right eyebrow not actively hemorrhaging  Neurological:     General: No focal deficit present.     Mental Status: He is alert and oriented to person, place, and time.     Sensory: Sensation is intact.      Motor: Motor function is intact.     Coordination: Coordination is intact.     Gait: Gait is intact.     Comments: Sensation intact in all 4 limbs Vision gross intact Cranial nerves III through XII intact  Psychiatric:        Mood and Affect: Mood normal.     ED Results / Procedures / Treatments   Labs (all labs ordered are listed, but only abnormal results are displayed) Labs Reviewed - No data to display  EKG None  Radiology CT Cervical Spine Wo Contrast  Result Date: 11/23/2022 CLINICAL DATA:  Trip and fall hitting head on trunk bumper. EXAM: CT CERVICAL SPINE WITHOUT CONTRAST TECHNIQUE: Multidetector CT imaging of the cervical spine was performed without intravenous contrast. Multiplanar CT image reconstructions were also generated. RADIATION DOSE REDUCTION: This exam was performed according to the departmental dose-optimization program which includes automated exposure control, adjustment of the mA and/or kV according to patient size and/or use of iterative reconstruction technique. COMPARISON:  None Available. FINDINGS: Alignment: No traumatic subluxation. Trace retrolisthesis of C3 on C4. Skull base and vertebrae: No acute fracture. Vertebral body heights are maintained. The dens and skull base are intact. Soft tissues and spinal canal: No prevertebral fluid or swelling. No visible canal hematoma. Disc levels: Disc space narrowing and spurring C3-C4 through C6-C7. Mild multilevel facet hypertrophy. Upper chest: No acute or unexpected findings. Other: Carotid calcifications. IMPRESSION: Mild degenerative change in the cervical spine without acute fracture or traumatic subluxation. Electronically Signed   By: Narda Rutherford M.D.   On: 11/23/2022 21:32   CT Head Wo Contrast  Result Date: 11/23/2022 CLINICAL DATA:  Trip and fall hitting head on trunk bumper. EXAM: CT HEAD WITHOUT CONTRAST TECHNIQUE: Contiguous axial images were obtained from the base of the skull through the vertex  without intravenous contrast. RADIATION DOSE REDUCTION: This exam was performed according to the departmental dose-optimization program which includes automated exposure control, adjustment of the mA and/or kV according to patient size and/or use of iterative reconstruction technique. COMPARISON:  None Available. FINDINGS: Brain: No intracranial hemorrhage, mass effect, or midline shift. Normal brain volume for age. No hydrocephalus. The basilar cisterns are patent. No evidence of territorial infarct or acute ischemia. No extra-axial or intracranial fluid collection. Vascular: No hyperdense vessel or unexpected calcification. Skull: No fracture or focal lesion. Sinuses/Orbits: Paranasal sinuses and mastoid air cells are clear. The visualized orbits are unremarkable. Other: No large scalp hematoma by CT. IMPRESSION: No acute intracranial abnormality. No skull fracture. Electronically  Signed   By: Narda Rutherford M.D.   On: 11/23/2022 21:27    Procedures .Marland KitchenLaceration Repair  Date/Time: 11/23/2022 8:33 PM  Performed by: Netta Corrigan, PA-C Authorized by: Netta Corrigan, PA-C   Consent:    Consent obtained:  Verbal   Consent given by:  Patient   Risks, benefits, and alternatives were discussed: yes     Risks discussed:  Infection, need for additional repair, nerve damage, poor wound healing, poor cosmetic result and pain   Alternatives discussed:  No treatment Universal protocol:    Imaging studies available: yes   Anesthesia:    Anesthesia method:  Local infiltration   Local anesthetic:  Lidocaine 2% WITH epi Laceration details:    Location:  Face   Face location:  R eyebrow   Length (cm):  1   Depth (mm):  1 Treatment:    Area cleansed with:  Saline   Amount of cleaning:  Standard   Irrigation solution:  Sterile saline   Irrigation volume:  250 mL   Irrigation method:  Pressure wash   Visualized foreign bodies/material removed: no     Debridement:  None Skin repair:    Repair  method:  Sutures   Suture size:  5-0   Suture material:  Prolene   Suture technique:  Simple interrupted   Number of sutures:  3 Approximation:    Approximation:  Close Repair type:    Repair type:  Simple Post-procedure details:    Dressing:  Open (no dressing)   Procedure completion:  Tolerated     Medications Ordered in ED Medications  acetaminophen (TYLENOL) tablet 1,000 mg (1,000 mg Oral Given 11/23/22 1840)  Tdap (BOOSTRIX) injection 0.5 mL (0.5 mLs Intramuscular Given 11/23/22 1840)  lidocaine-EPINEPHrine (XYLOCAINE W/EPI) 2 %-1:200000 (PF) injection 10 mL (10 mLs Infiltration Given 11/23/22 1840)    ED Course/ Medical Decision Making/ A&P                             Medical Decision Making Amount and/or Complexity of Data Reviewed Radiology: ordered.  Risk OTC drugs. Prescription drug management.   Tarry Delvalle Meester 72 y.o. presented today for fall. Working DDx that I considered at this time includes, but not limited to, vasovagal episode, mechanical fall, ICH, epidural/subdural hematoma, basilar skull fracture, anemia, electrolyte abnormalities, drug-induced, arrhythmia, UTI, fracture.  R/o DDx: vasovagal episode, mechanical fall, ICH, epidural/subdural hematoma, basilar skull fracture, anemia, electrolyte abnormalities, drug-induced, arrhythmia, UTI, fracture: These are considered less likely due to history of present illness and physical exam findings  Review of prior external notes: 10/29/2022 office visit  Unique Tests and My Interpretation:  CT head without contrast: Unremarkable CT cervical spine without contrast: Unremarkable  Discussion with Independent Historian:  Wife  Discussion of Management of Tests: None  Risk: Medium: prescription drug management  Risk Stratification Score: None  Plan: On exam patient was in no acute distress stable vitals.  Patient had midline cervical tenderness without abnormalities palpated on exam however the rest of his  physical exam including his neuroexam was unremarkable.  Patient is unsure when his last tetanus was and so his tetanus will be updated here.  CT head and neck will be ordered to rule out any fractures or dislocations however if CTs are clear laceration will be repaired and patient will be discharged.  Patient given Tylenol due to headache.  Patient given 3 sutures for his laceration on his right eyebrow.  CT was negative.  At this time patient feels stable to go home and will be discharged.  I spoke to the patient about having his 3 sutures removed in 7 to 10 days at an urgent care/primary care/ER.  Encouraged patient use Tylenol every 6 hours needed for pain.  Patient was given return precautions. Patient stable for discharge at this time.  Patient verbalized understanding of plan.         Final Clinical Impression(s) / ED Diagnoses Final diagnoses:  Fall, initial encounter  Laceration of right eyebrow, initial encounter    Rx / DC Orders ED Discharge Orders     None         Juanjesus, Drechsler 11/23/22 2143    Virgina Norfolk, DO 11/23/22 2321

## 2023-07-02 ENCOUNTER — Other Ambulatory Visit: Payer: Self-pay | Admitting: *Deleted

## 2023-07-02 DIAGNOSIS — I728 Aneurysm of other specified arteries: Secondary | ICD-10-CM

## 2023-07-08 ENCOUNTER — Ambulatory Visit (HOSPITAL_COMMUNITY)
Admission: RE | Admit: 2023-07-08 | Discharge: 2023-07-08 | Disposition: A | Discharge status: Home / Self Care | Payer: Medicare Other | Source: Ambulatory Visit | Attending: Surgery | Admitting: Surgery

## 2023-07-08 ENCOUNTER — Ambulatory Visit (INDEPENDENT_AMBULATORY_CARE_PROVIDER_SITE_OTHER): Payer: Medicare Other | Admitting: Physician Assistant

## 2023-07-08 VITALS — BP 130/85 | HR 65 | Temp 98.7°F | Resp 14 | Ht 70.0 in | Wt 194.0 lb

## 2023-07-08 DIAGNOSIS — I728 Aneurysm of other specified arteries: Secondary | ICD-10-CM | POA: Diagnosis not present

## 2023-07-08 NOTE — Progress Notes (Signed)
 [facility] [place] HISTORY AND PHYSICAL   History of Present Illness:  Patient is a 73 y.o. 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  This was found incidentally on CT scan for another issue in Feb 2017.  He was initially followed by Dr. Darrick Penna and now Dr. Myra Gianotti.  On his last visit the the diameter was 1.6 cm.   He denies post prandial pain, no abnormal abdominal or lumbar pain note.  No evidence of aortic aneurysm on the all previous studies.  He deneis claudication, est pain and non healing wounds.  He is medically managed for hypertension with ACE inhibitor. He takes a statin for hypercholesterolemia. He is a non-smoker.   Past Medical History:  Diagnosis Date   GERD with stricture    Hyperlipidemia    Hypertension    Situational stress     Past Surgical History:  Procedure Laterality Date   cardiolite stresstest  fall 2010   COLONOSCOPY  fall 2010   UPPER GASTROINTESTINAL ENDOSCOPY  fall 2010    ROS:   General:  No weight loss, Fever, chills  HEENT: No recent headaches, no nasal bleeding, no visual changes, no sore throat  Neurologic: No dizziness, blackouts, seizures. No recent symptoms of stroke or mini- stroke. No recent episodes of slurred speech, or temporary blindness.  Cardiac: No recent episodes of chest pain/pressure, no shortness of breath at rest.  No shortness of breath with exertion.  Denies history of atrial fibrillation or irregular heartbeat  Vascular: No history of rest pain in feet.  No history of claudication.  No history of non-healing ulcer, No history of DVT   Pulmonary: No home oxygen, no productive cough, no hemoptysis,  No asthma or wheezing  Musculoskeletal:  [ ]  Arthritis, [ ]  Low back pain,  [ ]  Joint pain  Hematologic:No history of hypercoagulable state.  No history of easy bleeding.  No history of anemia  Gastrointestinal: No hematochezia or melena,  No gastroesophageal reflux, no trouble  swallowing  Urinary: [ ]  chronic Kidney disease, [ ]  on HD - [ ]  MWF or [ ]  TTHS, [ ]  Burning with urination, [ ]  Frequent urination, [ ]  Difficulty urinating;   Skin: No rashes  Psychological: No history of anxiety,  No history of depression  Social History Social History   Tobacco Use   Smoking status: Never   Smokeless tobacco: Never  Vaping Use   Vaping status: Never Used  Substance Use Topics   Alcohol use: Yes   Drug use: No    Family History Family History  Problem Relation Age of Onset   Cancer Mother        lung    Lymphoma Father    Coronary artery disease Sister     Allergies  Allergies  Allergen Reactions   Penicillins Anaphylaxis    Other Reaction(s): Eruption of skin   Sulfa Antibiotics Rash     Current Outpatient Medications  Medication Sig Dispense Refill   Clindamycin-Benzoyl Per, Refr, gel      Coenzyme Q10 60 MG CAPS Take by mouth.     ibuprofen (ADVIL,MOTRIN) 800 MG tablet TAKE 1 TABLET BY MOUTH EVERY 8 HOURS AS NEEDED 30 tablet 2   lisinopril (PRINIVIL,ZESTRIL) 20 MG tablet TAKE 1 TABLET DAILY 90 tablet 4   losartan (COZAAR) 100 MG tablet TAKE 1 TABLET DAILY 90 tablet 4   Multiple Vitamin (MULTIVITAMIN) tablet Take 1 tablet by mouth daily.     omeprazole (  PRILOSEC) 40 MG capsule TAKE 1 CAPSULE BY MOUTH EVERY DAY 90 capsule 2   sertraline (ZOLOFT) 50 MG tablet Take 1 tablet (50 mg total) by mouth daily. 90 tablet 2   simvastatin (ZOCOR) 40 MG tablet TAKE 1 TABLET EVERY EVENING 90 tablet 2   testosterone cypionate (DEPOTESTOSTERONE CYPIONATE) 200 MG/ML injection INJECT 1.5 MLS IN THE MUSCLE EVERY 14 DAYS AS DIRECTED 10 mL 0   triazolam (HALCION) 0.25 MG tablet Take 1 tablet (0.25 mg total) by mouth at bedtime as needed. 90 tablet 2   VASCEPA 1 g capsule      No current facility-administered medications for this visit.    Physical Examination  Vitals:   07/08/23 1011  BP: 130/85  Pulse: 65  Resp: 14  Temp: 98.7 F (37.1 C)  TempSrc:  Temporal  SpO2: 93%  Weight: 194 lb (88 kg)  Height: 5\' 10"  (1.778 m)    Body mass index is 27.84 kg/m.  General:  Alert and oriented, no acute distress HEENT: Normal Neck: No bruit or JVD Pulmonary: Clear to auscultation bilaterally Cardiac: Regular Rate and Rhythm without murmur Abdomen: Soft, non-tender, non-distended, no mass, no scars Skin: No rash, no ischemic changes Musculoskeletal: No deformity or edema  Neurologic: Upper and lower extremity motor 5/5 and symmetric  DATA:  Duplex Findings:  +----------------------+--------+--------+------+--------+  Mesenteric           PSV cm/sEDV cm/sPlaqueComments  +----------------------+--------+--------+------+--------+  Aorta Prox               98                           +----------------------+--------+--------+------+--------+  Celiac Artery Proximal  107                           +----------------------+--------+--------+------+--------+  SMA Proximal            132                           +----------------------+--------+--------+------+--------+  SMA Mid                 130                           +----------------------+--------+--------+------+--------+  CHA                     76                           +----------------------+--------+--------+------+--------+  Splenic                109                           +----------------------+--------+--------+------+--------+  IMA                    141                           +----------------------+--------+--------+------+--------+     Mesenteric Technologist observations: maximum celiac artery diameter is  1.5cm    Summary:  Mesenteric:  Normal Celiac artery , Superior Mesenteric artery and Inferior Mesenteric  artery  findings.  Maximum diameter of the celiac artery is  1.5 cm.  No significant change compared to previous studies.   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  He remains asymptomatic:  Abdominal pain: This is the most common symptom, typically located in the upper abdomen (epigastric region).  Nausea and vomiting: These symptoms may occur due to compression of the stomach or pancreas by the aneurysm.  Back pain: This can occur if the aneurysm presses on nearby nerves or structures.  Jaundice: This yellowing of the skin and eyes may indicate blockage of the bile duct by the aneurysm.  Gastrointestinal bleeding: This can occur if the aneurysm ruptures.  A palpable mass: In some cases, the aneurysm may be felt as a pulsating mass in the upper abdomen.   He will f/u in 2 years for repeat studies.      Mosetta Pigeon PA-C [facility] Office: [phone]  MD in clinic [place]

## 2023-10-28 ENCOUNTER — Telehealth: Payer: Self-pay | Admitting: Gastroenterology

## 2023-10-28 NOTE — Telephone Encounter (Signed)
 Good morning Dr. Leigh, Patient preferred Provider   I received a call from this patient wishing to schedule a colonoscopy with you due to being recommend. Patient last had a colonoscopy with Dr. Lucio at [facility] in 03/2016. Since then patient states Dr.Bray has recently retired and he was recommended our practice for continuation of care. Records are in Epic for you to review. Would you please review and advise on scheduling?  Thank you.

## 2023-10-28 NOTE — Telephone Encounter (Signed)
 I'm happy to see him as a new patient, next routine office visit opening. Thanks

## 2023-11-04 ENCOUNTER — Encounter: Payer: Self-pay | Admitting: Gastroenterology

## 2023-11-04 NOTE — Telephone Encounter (Signed)
Call and left message for patient to call back and schedule

## 2024-01-03 ENCOUNTER — Other Ambulatory Visit: Payer: Self-pay | Admitting: Medical Genetics

## 2024-01-07 ENCOUNTER — Other Ambulatory Visit

## 2024-01-07 ENCOUNTER — Encounter: Payer: Self-pay | Admitting: Gastroenterology

## 2024-01-07 ENCOUNTER — Ambulatory Visit (INDEPENDENT_AMBULATORY_CARE_PROVIDER_SITE_OTHER): Admitting: Gastroenterology

## 2024-01-07 VITALS — BP 104/68 | HR 72 | Ht 68.0 in | Wt 195.4 lb

## 2024-01-07 DIAGNOSIS — D509 Iron deficiency anemia, unspecified: Secondary | ICD-10-CM

## 2024-01-07 DIAGNOSIS — K219 Gastro-esophageal reflux disease without esophagitis: Secondary | ICD-10-CM | POA: Diagnosis not present

## 2024-01-07 DIAGNOSIS — Z79899 Other long term (current) drug therapy: Secondary | ICD-10-CM

## 2024-01-07 DIAGNOSIS — R140 Abdominal distension (gaseous): Secondary | ICD-10-CM

## 2024-01-07 MED ORDER — NA SULFATE-K SULFATE-MG SULF 17.5-3.13-1.6 GM/177ML PO SOLN: 1.0000 | Freq: Once | ORAL | 0 refills | Status: DC

## 2024-01-07 NOTE — Progress Notes (Signed)
 HPI :  73 year old male with a history of iron deficiency, hypogonadism on testosterone  supplementation, microscopic colitis, GERD here to establish care for some of these issues.  This is his first time to our practice, previously followed by Dr. Lucio of digestive health who is retiring.  He reports back in 2020 04/2020 he saw hematology for anemia, told he had low ferritin.  At that time it was thought to be due to blood donation as he had been doing that periodically as his hemoglobin had risen on testosterone .  He had his testosterone  formulation changed which seem to help with this issue and he has not needed to donate blood for at least 1 to 2 years.  He was seen by hematology more recently and in February was found to have a ferritin of 24.  He had labs repeated on August 19, see care everywhere for details, hemoglobin is 15.8, MCV 85, but ferritin is low at 20, and his iron saturation is 12% (low).   He denies any blood in his stools.  States he has pretty regular bowel habits without any diarrhea.  He does have some abdominal bloating that can bother him in his abdomen fairly frequently.  He has tried probiotics for this with no improvement.  Denies constipation.  His last colonoscopy was in 2017, no polyps removed, he did have random biopsies for diarrhea at the time and was found to have lymphocytic colitis.  Of note he has been on Zoloft  for years, states he was on it during the time of that exam.  He states his diarrhea has since resolved, he cannot recall what intervention he had at the time for that issue.    He has also had an EGD in the past for history of reflux.  He has been on omeprazole  40 mg daily for a long time.  Thinks the EGD did not show any Barrett's esophagus.  He states the omeprazole  works quite well for him and controls his symptoms however if he misses a dose he will have significant pyrosis and heartburn that can bother him.  He endorses remote history of dysphagia and  had his esophagus stretched before, that symptom has since resolved and he typically does not have that unless he eats too fast or eats large bites.  Denies any nausea or vomiting or abdominal pains otherwise.  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  He inquires about long-term management of his reflux and evaluation of iron deficiency   Colonoscopy 04/06/2016: normal colon, biopsies c/w lymphocytic colitis    Past Medical History:  Diagnosis Date   Celiac artery aneurysm (HCC)    GERD with stricture    Hyperlipidemia    Hypertension    IDA (iron deficiency anemia)    Microscopic colitis    Situational stress    Skin cancer      Past Surgical History:  Procedure Laterality Date   cardiolite stresstest  fall 2010   COLONOSCOPY  fall 2010   HAND RECONSTRUCTION Left    MENISCUS REPAIR Left    UPPER GASTROINTESTINAL ENDOSCOPY  fall 2010   Family History  Problem Relation Age of Onset   Cancer Mother        on aorta   Lymphoma Father    Hypertension Father    Coronary artery disease Sister    Hyperlipidemia Sister    Bone cancer Maternal Grandfather  Cancer Paternal Grandfather        type unknown   Social History   Tobacco Use   Smoking status: Never   Smokeless tobacco: Never  Vaping Use   Vaping status: Never Used  Substance Use Topics   Alcohol use: Yes    Comment: a glass of wine once a week   Drug use: No   Current Outpatient Medications  Medication Sig Dispense Refill   Coenzyme Q10 60 MG CAPS Take by mouth.     Multiple Vitamin (MULTIVITAMIN) tablet Take 1 tablet by mouth daily.     olmesartan (BENICAR) 20 MG tablet Take 20 mg by mouth daily.     omeprazole  (PRILOSEC) 40 MG capsule TAKE 1 CAPSULE BY MOUTH EVERY DAY 90 capsule 2   rosuvastatin (CRESTOR) 20 MG tablet Take 20 mg by mouth at bedtime.     sertraline  (ZOLOFT ) 50 MG tablet Take 1 tablet (50 mg  total) by mouth daily. 90 tablet 2   triazolam  (HALCION ) 0.25 MG tablet Take 1 tablet (0.25 mg total) by mouth at bedtime as needed. 90 tablet 2   VASCEPA 1 g capsule      XYOSTED 50 MG/0.5ML SOAJ Inject 0.5 mLs into the skin once a week.     No current facility-administered medications for this visit.   Allergies  Allergen Reactions   Penicillins Anaphylaxis    Other Reaction(s): Eruption of skin   Sulfa  Antibiotics Rash     Review of Systems: All systems reviewed and negative except where noted in HPI.   Labs per HPI, reviewed in Epic  Physical Exam: BP 104/68 (BP Location: Left Arm, Patient Position: Sitting, Cuff Size: Normal)   Pulse 72   Ht 5' 8 (1.727 m) Comment: height measured without shoes  Wt 195 lb 6 oz (88.6 kg)   BMI 29.71 kg/m  Constitutional: Pleasant,well-developed, male in no acute distress. HEENT: Normocephalic and atraumatic. Conjunctivae are normal. No scleral icterus. Neck supple.  Cardiovascular: Normal rate, regular rhythm.  Pulmonary/chest: Effort normal and breath sounds normal. Abdominal: Soft, nondistended, nontender. There are no masses palpable. No hepatomegaly. Extremities: no edema Lymphadenopathy: No cervical adenopathy noted. Neurological: Alert and oriented to person place and time. Skin: Skin is warm and dry. No rashes noted. Psychiatric: Normal mood and affect. Behavior is normal.   ASSESSMENT: 73 y.o. male here for assessment of the following  1. Iron deficiency anemia, unspecified iron deficiency anemia type   2. Gastroesophageal reflux disease, unspecified whether esophagitis present   3. Long-term current use of proton pump inhibitor therapy   4. Bloating    History of iron deficiency years ago, in the setting of blood donation.  He stopped donating blood a few years ago, has since had recurrent iron deficiency although no associated with this.  We discussed DDx for IDA.  His last colonoscopy was back in 2017, and had an EGD  remotely as well.  Given recurrent iron deficiency I think reasonable to proceed with EGD and colonoscopy to further evaluate.  We discussed with him the risks and benefits, he understands and wants to proceed.  As part of that evaluation, given his history of ongoing bloating and gas over time, reasonable to screen for celiac disease.  Will send serologies and also evaluate endoscopically for that.  Otherwise, we discussed his history of GERD, he is on moderate dose of omeprazole  which is controlling his symptoms but if he misses any doses he will have breakthrough.  Long-term discussed risks of chronic  PPI use.  Overall recommend using lowest dose needed to control symptoms.  Will await EGD results first, ensure no BE, and consider tapering to lower dose over time as tolerated.  Regarding bloating, discussed causes of intestinal gas production and bloating.  Will screen for celiac disease, I think okay to stop probiotic as it has not provided any benefit.  Reviewed role of diet, given a handout on low FODMAP diet to make sure he is not eating any high risk foods that can cause bloating.  Will await these measures, further recommendations pending his course and results of his exams   PLAN: - schedule EGD and colonoscopy at the [facility] - celiac serologies at the lab today - discussed long term use of omeprazole  and risks - continue current dosing of omeprazole  for now, long term use lowest dose needed to control symptoms - stop probiotics - not helping - handout on low FODMAP diet - avoid high risk foods for bloating - seeing hematology for labs next month, defer monitoring of iron levels and iron repletion to them  Marcey Naval, MD [facility]  CC: Sophronia Ozell BROCKS, MD

## 2024-01-07 NOTE — Patient Instructions (Addendum)
 Stop Probiotics  Please go to the lab in the basement of our building to have lab work done as you leave today. Hit B for basement when you get on the elevator.  When the doors open the lab is on your left.  We will call you with the results. Thank you.   You have been scheduled for an endoscopy and colonoscopy with Dr. Leigh. Please follow the written instructions given to you at your visit today.  If you use inhalers (even only as needed), please bring them with you on the day of your procedure.  DO NOT TAKE 7 DAYS PRIOR TO TEST- Trulicity (dulaglutide) Ozempic, Wegovy (semaglutide) Mounjaro (tirzepatide) Bydureon Bcise (exanatide extended release)  DO NOT TAKE 1 DAY PRIOR TO YOUR TEST Rybelsus (semaglutide) Adlyxin (lixisenatide) Victoza (liraglutide) Byetta (exanatide) ___________________________________________________________________________      Thank you for entrusting me with your care and for choosing [facility], Dr. Elspeth Leigh    _______________________________________________________  If your blood pressure at your visit was 140/90 or greater, please contact your primary care physician to follow up on this.  _______________________________________________________  If you are age 20 or older, your body mass index should be between 23-30. Your Body mass index is 29.71 kg/m. If this is out of the aforementioned range listed, please consider follow up with your Primary Care Provider.  If you are age 56 or younger, your body mass index should be between 19-25. Your Body mass index is 29.71 kg/m. If this is out of the aformentioned range listed, please consider follow up with your Primary Care Provider.   ________________________________________________________  The  GI providers would like to encourage you to use MYCHART to communicate with providers for non-urgent requests or questions.  Due to long hold times on the telephone, sending  your provider a message by [facility] may be a faster and more efficient way to get a response.  Please allow 48 business hours for a response.  Please remember that this is for non-urgent requests.  _______________________________________________________  [facility] is using a team-based approach to care.  Your team is made up of your doctor and two to three APPS. Our APPS (Nurse Practitioners and Physician Assistants) work with your physician to ensure care continuity for you. They are fully qualified to address your health concerns and develop a treatment plan. They communicate directly with your gastroenterologist to care for you. Seeing the Advanced Practice Practitioners on your physician's team can help you by facilitating care more promptly, often allowing for earlier appointments, access to diagnostic testing, procedures, and other specialty referrals.

## 2024-01-08 ENCOUNTER — Encounter: Payer: Self-pay | Admitting: Gastroenterology

## 2024-01-08 ENCOUNTER — Other Ambulatory Visit: Payer: Self-pay

## 2024-01-08 LAB — TISSUE TRANSGLUTAMINASE, IGA: (tTG) Ab, IgA: <1 U/mL

## 2024-01-08 LAB — IGA: Immunoglobulin A: 79 mg/dL (ref 70–320)

## 2024-01-08 MED ORDER — NA SULFATE-K SULFATE-MG SULF 17.5-3.13-1.6 GM/177ML PO SOLN: 1.0000 | Freq: Once | ORAL | 0 refills | Status: AC

## 2024-01-09 ENCOUNTER — Ambulatory Visit: Payer: Self-pay | Admitting: Gastroenterology

## 2024-01-10 ENCOUNTER — Encounter: Payer: Self-pay | Admitting: Student in an Organized Health Care Education/Training Program

## 2024-01-10 ENCOUNTER — Ambulatory Visit (INDEPENDENT_AMBULATORY_CARE_PROVIDER_SITE_OTHER): Admitting: Student in an Organized Health Care Education/Training Program

## 2024-01-10 VITALS — BP 131/81 | HR 54 | Ht 68.4 in | Wt 194.0 lb

## 2024-01-10 DIAGNOSIS — I10 Essential (primary) hypertension: Secondary | ICD-10-CM

## 2024-01-10 DIAGNOSIS — D500 Iron deficiency anemia secondary to blood loss (chronic): Secondary | ICD-10-CM

## 2024-01-10 DIAGNOSIS — E7800 Pure hypercholesterolemia, unspecified: Secondary | ICD-10-CM

## 2024-01-10 DIAGNOSIS — E291 Testicular hypofunction: Secondary | ICD-10-CM

## 2024-01-10 DIAGNOSIS — R7303 Prediabetes: Secondary | ICD-10-CM | POA: Diagnosis not present

## 2024-01-10 NOTE — Patient Instructions (Signed)
  VISIT SUMMARY: Today, you came in to establish care with a new primary care physician. We reviewed your medical history, including your current treatments and overall health status. We discussed your testosterone  replacement therapy, cholesterol management, blood pressure control, and general health maintenance.  YOUR PLAN: -IRON DEFICIENCY WITHOUT ANEMIA: You have low iron levels but your hemoglobin is normal, meaning you do not have anemia. This is likely due to past blood donations. We will follow up with your hematologist in October to reassess your iron levels and management.  -TESTOSTERONE  DEFICIENCY ON REPLACEMENT THERAPY: You are on testosterone  replacement therapy to manage fatigue and low energy levels. This treatment has improved your energy and cholesterol levels. We discussed the potential long-term risks, including heart issues, and will continue therapy as long as the benefits outweigh the risks.  -ESSENTIAL HYPERTENSION: Your high blood pressure is well-controlled with medication, although you sometimes experience higher readings in medical settings. Continue taking olmesartan as prescribed.  -DYSLIPIDEMIA WITH ABNORMAL LIPID PARTICLE SIZE: Your cholesterol levels are well-managed with your current medications. We discussed the importance of evidence-based care and avoiding unnecessary tests. There is no history of heart disease.  -GENERAL HEALTH MAINTENANCE: You are maintaining a good quality of life through regular exercise like swimming and biking. Continue with regular check-ups and screenings to monitor your health.  INSTRUCTIONS: Please follow up with your hematologist in October to reassess your iron levels. Schedule a follow-up appointment with us  in 3 months to review your colonoscopy results and reassess your overall health status.

## 2024-01-10 NOTE — Assessment & Plan Note (Signed)
 Chronic and stable.  On TRT since around 2018.  Last testosterone  level was 638 in March.  Currently using subcutaneous testosterone  25 mg weekly.  We talked about the risks of continued TRT in older men.  He has had no history of ischemic events.  He is having a side effect of polycythemia which has been managed with intermittent phlebotomy.  For now he finds great benefit with TRT in his exertional capacity and daily energy levels.  Wants to continue which I think is reasonable for now.

## 2024-01-10 NOTE — Assessment & Plan Note (Signed)
 Chronic and stable issue.  Last cholesterol in Care Everywhere is an LDL of 90 earlier this year.  He has had no history of ischemic events.  Tolerating rosuvastatin and Vascepa well.  We reviewed some testing he did to do cholesterol particle measurements.  Currently I think he is on the best medications for reducing the risk of an ischemic events, and he is doing a fantastic job with his lifestyle.  Probably the biggest risk for a future ischemic event at this point is his age, and the use of TRT which we discussed.

## 2024-01-10 NOTE — Assessment & Plan Note (Signed)
 Intermittent issue in the past dating back to at least 2019 in the records.  Has worsened recently after he started donating blood to try to counteract the side effect of polycythemia from testosterone  replacement therapy.  No overt bleeding.  He is consulted with GI and is planning on a colonoscopy.  He is consulting with hematology, last iron levels in August showed moderate deficiency but no anemia.  Hematology has not restarted oral iron because of the risk of inducing polycythemia again.  Patient is asymptomatic for now.  Will follow-up after the colonoscopy to rule out source of occult bleeding.

## 2024-01-10 NOTE — Assessment & Plan Note (Signed)
 Chronic and stable.  Blood pressure at goal today at 131/81.  Doing well on olmesartan 20 mg daily.  Renal function is normal on labs in Care Everywhere.  I recommend continuing the olmesartan.

## 2024-01-10 NOTE — Progress Notes (Signed)
 New Patient Office Visit  Subjective    Patient ID: Steven [name], male    DOB: 1951-03-12  Age: 73 y.o. MRN: [id number]  CC:  Chief Complaint  Patient presents with   Establish Care    HPI   Discussed the use of AI scribe software for clinical note transcription with the patient, who gave verbal consent to proceed.  History of Present Illness Steven [name] is a 73 year old male who presents for establishing care with a new primary care physician.  He has been on testosterone  replacement therapy since 2018 due to fatigue and low energy levels, currently using Xyosted, a subcutaneous injection. He experienced elevated hemoglobin levels in the past, requiring blood donations, which led to iron deficiency anemia. His recent ferritin level is low at 20, but he is not on iron supplements. Hemoglobin is normal at 15 g/dL. He has a recent ferritin level of 20.  He has a history of high cholesterol, managed with medications including Vascepa and previously niacin . His cholesterol numbers are currently good, but recent tests showed abnormal LDL particle sizes. He has not had any cardiac events, but his sister has high cholesterol and has had a stent placed.  He has high blood pressure, controlled with olmesartan. He experiences occasional white coat hypertension but generally maintains good blood pressure readings.  He mentions a past basal cell carcinoma removal and regular dermatological check-ups. He also has a history of microscopic colitis, which has not caused significant issues.  He is currently taking sertraline  50 mg daily, initially prescribed for mood management due to perceived aggression by his wife. He has not noticed significant changes with this medication.  He is active, engaging in swimming and biking regularly. He has a history of a stress test and MRI due to concerns about aortic aneurysm, which were resolved as non-issues. No chest pain, chest pressure, or  breathing difficulties. Reports occasional white coat hypertension. No recent surgeries. Reports hearing issues and uses hearing aids. Vision recently checked with early cataract in left eye.    Outpatient Encounter Medications as of 01/10/2024  Medication Sig   Coenzyme Q10 60 MG CAPS Take by mouth.   Multiple Vitamin (MULTIVITAMIN) tablet Take 1 tablet by mouth daily.   olmesartan (BENICAR) 20 MG tablet Take 20 mg by mouth daily.   omeprazole  (PRILOSEC) 40 MG capsule TAKE 1 CAPSULE BY MOUTH EVERY DAY   rosuvastatin (CRESTOR) 20 MG tablet Take 20 mg by mouth at bedtime.   sertraline  (ZOLOFT ) 50 MG tablet Take 1 tablet (50 mg total) by mouth daily.   triazolam  (HALCION ) 0.25 MG tablet Take 1 tablet (0.25 mg total) by mouth at bedtime as needed.   VASCEPA 1 g capsule    XYOSTED 50 MG/0.5ML SOAJ Inject 0.5 mLs into the skin once a week.   No facility-administered encounter medications on file as of 01/10/2024.    Past Medical History:  Diagnosis Date   Allergy infant reaction   caused liver issues   Anxiety 1980   Military service   Celiac artery aneurysm (HCC)    GERD with stricture    Hyperlipidemia    Hypertension    IDA (iron deficiency anemia)    Microscopic colitis    Situational stress    Skin cancer     Past Surgical History:  Procedure Laterality Date   cardiolite stresstest  fall 2010   COLONOSCOPY  fall 2010   COSMETIC SURGERY  2022   HAND RECONSTRUCTION Left  MENISCUS REPAIR Left    UPPER GASTROINTESTINAL ENDOSCOPY  fall 2010    Family History  Problem Relation Age of Onset   Cancer Mother        on aorta   Lymphoma Father    Hypertension Father    Cancer Father    Coronary artery disease Sister    Hyperlipidemia Sister    Bone cancer Maternal Grandfather    Cancer Paternal Grandfather        type unknown        Objective    BP 131/81   Pulse (!) 54   Ht 5' 8.4 (1.737 m)   Wt 194 lb (88 kg)   SpO2 100%   BMI 29.15 kg/m   Physical  Exam  Gen: Well-appearing man Eyes: Normal Ears: Gray tympanic membranes with some mild scarring, no light reflex, nonerythematous, normal ear canals, decreased hearing Mouth: Normal oropharynx Neck: Normal thyroid , no nodules or adenopathy Heart: Regular, no murmur Lungs: Unlabored, clear throughout, normal percussion Abd: Soft, small umbilical hernia is not incarcerated with no skin changes, mildly distended and tympanic to percussion, nontender, no organomegaly Ext: Warm, above average muscle bulk, no edema, normal joints Neuro: Alert, conversational, full strength upper and lower extremities, normal gait and balance Psych: Very pleasant to talk with, propria mood and affect, not anxious or depressed.     Assessment & Plan:    Problem List Items Addressed This Visit       High   Hyperlipidemia (Chronic)   Chronic and stable issue.  Last cholesterol in Care Everywhere is an LDL of 90 earlier this year.  He has had no history of ischemic events.  Tolerating rosuvastatin and Vascepa well.  We reviewed some testing he did to do cholesterol particle measurements.  Currently I think he is on the best medications for reducing the risk of an ischemic events, and he is doing a fantastic job with his lifestyle.  Probably the biggest risk for a future ischemic event at this point is his age, and the use of TRT which we discussed.      Iron deficiency anemia due to chronic blood loss - Primary (Chronic)   Intermittent issue in the past dating back to at least 2019 in the records.  Has worsened recently after he started donating blood to try to counteract the side effect of polycythemia from testosterone  replacement therapy.  No overt bleeding.  He is consulted with GI and is planning on a colonoscopy.  He is consulting with hematology, last iron levels in August showed moderate deficiency but no anemia.  Hematology has not restarted oral iron because of the risk of inducing polycythemia again.   Patient is asymptomatic for now.  Will follow-up after the colonoscopy to rule out source of occult bleeding.        Medium    Essential hypertension (Chronic)   Chronic and stable.  Blood pressure at goal today at 131/81.  Doing well on olmesartan 20 mg daily.  Renal function is normal on labs in Care Everywhere.  I recommend continuing the olmesartan.      Prediabetes (Chronic)   Chronic and stable.  Last A1c in September was 6.1%.  I recommend rechecking this every 6-12 months.      Primary testicular failure (Chronic)   Chronic and stable.  On TRT since around 2018.  Last testosterone  level was 638 in March.  Currently using subcutaneous testosterone  25 mg weekly.  We talked about the risks of  continued TRT in older men.  He has had no history of ischemic events.  He is having a side effect of polycythemia which has been managed with intermittent phlebotomy.  For now he finds great benefit with TRT in his exertional capacity and daily energy levels.  Wants to continue which I think is reasonable for now.       Return in about 3 months (around 04/10/2024).   Cleatus Debby Specking, MD

## 2024-01-10 NOTE — Assessment & Plan Note (Signed)
 Chronic and stable.  Last A1c in September was 6.1%.  I recommend rechecking this every 6-12 months.

## 2024-01-13 ENCOUNTER — Ambulatory Visit: Admitting: Student in an Organized Health Care Education/Training Program

## 2024-01-13 ENCOUNTER — Encounter: Payer: Self-pay | Admitting: Student in an Organized Health Care Education/Training Program

## 2024-01-17 ENCOUNTER — Other Ambulatory Visit: Payer: Self-pay | Admitting: Family Medicine

## 2024-01-17 DIAGNOSIS — I2510 Atherosclerotic heart disease of native coronary artery without angina pectoris: Secondary | ICD-10-CM

## 2024-01-17 NOTE — Progress Notes (Signed)
 Multiple CAD risk factors. No prior testing/baseline CAC ordered.

## 2024-01-23 ENCOUNTER — Other Ambulatory Visit

## 2024-02-07 ENCOUNTER — Ambulatory Visit (HOSPITAL_BASED_OUTPATIENT_CLINIC_OR_DEPARTMENT_OTHER)
Admission: RE | Admit: 2024-02-07 | Discharge: 2024-02-07 | Disposition: A | Discharge status: Home / Self Care | Payer: Self-pay | Source: Ambulatory Visit | Attending: Family Medicine | Admitting: Family Medicine

## 2024-02-07 DIAGNOSIS — I2510 Atherosclerotic heart disease of native coronary artery without angina pectoris: Secondary | ICD-10-CM | POA: Insufficient documentation

## 2024-02-13 ENCOUNTER — Other Ambulatory Visit

## 2024-02-13 DIAGNOSIS — Z006 Encounter for examination for normal comparison and control in clinical research program: Secondary | ICD-10-CM

## 2024-02-20 ENCOUNTER — Ambulatory Visit (AMBULATORY_SURGERY_CENTER): Admitting: Gastroenterology

## 2024-02-20 ENCOUNTER — Encounter: Payer: Self-pay | Admitting: Gastroenterology

## 2024-02-20 VITALS — BP 112/64 | HR 67 | Temp 97.0°F | Resp 16 | Ht 68.0 in | Wt 195.0 lb

## 2024-02-20 DIAGNOSIS — D122 Benign neoplasm of ascending colon: Secondary | ICD-10-CM | POA: Diagnosis not present

## 2024-02-20 DIAGNOSIS — K449 Diaphragmatic hernia without obstruction or gangrene: Secondary | ICD-10-CM

## 2024-02-20 DIAGNOSIS — K319 Disease of stomach and duodenum, unspecified: Secondary | ICD-10-CM

## 2024-02-20 DIAGNOSIS — K3189 Other diseases of stomach and duodenum: Secondary | ICD-10-CM

## 2024-02-20 DIAGNOSIS — K317 Polyp of stomach and duodenum: Secondary | ICD-10-CM

## 2024-02-20 DIAGNOSIS — Q439 Congenital malformation of intestine, unspecified: Secondary | ICD-10-CM | POA: Diagnosis not present

## 2024-02-20 DIAGNOSIS — D123 Benign neoplasm of transverse colon: Secondary | ICD-10-CM

## 2024-02-20 DIAGNOSIS — D509 Iron deficiency anemia, unspecified: Secondary | ICD-10-CM

## 2024-02-20 DIAGNOSIS — I890 Lymphedema, not elsewhere classified: Secondary | ICD-10-CM

## 2024-02-20 DIAGNOSIS — K635 Polyp of colon: Secondary | ICD-10-CM | POA: Diagnosis not present

## 2024-02-20 DIAGNOSIS — D124 Benign neoplasm of descending colon: Secondary | ICD-10-CM

## 2024-02-20 DIAGNOSIS — K5730 Diverticulosis of large intestine without perforation or abscess without bleeding: Secondary | ICD-10-CM

## 2024-02-20 DIAGNOSIS — Q399 Congenital malformation of esophagus, unspecified: Secondary | ICD-10-CM

## 2024-02-20 DIAGNOSIS — K219 Gastro-esophageal reflux disease without esophagitis: Secondary | ICD-10-CM

## 2024-02-20 DIAGNOSIS — K648 Other hemorrhoids: Secondary | ICD-10-CM

## 2024-02-20 MED ORDER — SODIUM CHLORIDE 0.9 % IV SOLN: 500.0000 mL | INTRAVENOUS | Status: DC

## 2024-02-20 NOTE — Progress Notes (Signed)
 1313 Robinul 0.1 mg IV given due large amount of secretions upon assessment.  MD made aware, vss

## 2024-02-20 NOTE — Patient Instructions (Signed)
 Please read handouts provided. Continue present medications. Await pathology results. Resume previous diet. Await course on iron and monitoring Hgb.   YOU HAD AN ENDOSCOPIC PROCEDURE TODAY AT THE [facility]:   Refer to the procedure report that was given to you for any specific questions about what was found during the examination.  If the procedure report does not answer your questions, please call your gastroenterologist to clarify.  If you requested that your care partner not be given the details of your procedure findings, then the procedure report has been included in a sealed envelope for you to review at your convenience later.  YOU SHOULD EXPECT: Some feelings of bloating in the abdomen. Passage of more gas than usual.  Walking can help get rid of the air that was put into your GI tract during the procedure and reduce the bloating. If you had a lower endoscopy (such as a colonoscopy or flexible sigmoidoscopy) you may notice spotting of blood in your stool or on the toilet paper. If you underwent a bowel prep for your procedure, you may not have a normal bowel movement for a few days.  Please Note:  You might notice some irritation and congestion in your nose or some drainage.  This is from the oxygen used during your procedure.  There is no need for concern and it should clear up in a day or so.  SYMPTOMS TO REPORT IMMEDIATELY:  Following lower endoscopy (colonoscopy or flexible sigmoidoscopy):  Excessive amounts of blood in the stool  Significant tenderness or worsening of abdominal pains  Swelling of the abdomen that is new, acute  Fever of 100F or higher  Following upper endoscopy (EGD)  Vomiting of blood or coffee ground material  New chest pain or pain under the shoulder blades  Painful or persistently difficult swallowing  New shortness of breath  Fever of 100F or higher  Black, tarry-looking stools  For urgent or emergent issues, a gastroenterologist can be  reached at any hour by calling [phone]. Do not use MyChart messaging for urgent concerns.    DIET:  We do recommend a small meal at first, but then you may proceed to your regular diet.  Drink plenty of fluids but you should avoid alcoholic beverages for 24 hours.  ACTIVITY:  You should plan to take it easy for the rest of today and you should NOT DRIVE or use heavy machinery until tomorrow (because of the sedation medicines used during the test).    FOLLOW UP: Our staff will call the number listed on your records the next business day following your procedure.  We will call around 7:15- 8:00 am to check on you and address any questions or concerns that you may have regarding the information given to you following your procedure. If we do not reach you, we will leave a message.     If any biopsies were taken you will be contacted by phone or by letter within the next 1-3 weeks.  Please call us  at [phone] if you have not heard about the biopsies in 3 weeks.    SIGNATURES/CONFIDENTIALITY: You and/or your care partner have signed paperwork which will be entered into your electronic medical record.  These signatures attest to the fact that that the information above on your After Visit Summary has been reviewed and is understood.  Full responsibility of the confidentiality of this discharge information lies with you and/or your care-partner.

## 2024-02-20 NOTE — Op Note (Signed)
 [facility] Patient Name: Steven [name] Procedure Date: 02/20/2024 1:20 PM MRN: [id number] Endoscopist: Elspeth P. Leigh , MD, [id number] Age: 73 Referring MD:  Date of Birth: November 10, 1950 Gender: Male Account #: [id number] Procedure:                Colonoscopy Indications:              Iron deficiency anemia Medicines:                Monitored Anesthesia Care Procedure:                Pre-Anesthesia Assessment:                           - Prior to the procedure, a History and Physical                            was performed, and patient medications and                            allergies were reviewed. The patient's tolerance of                            previous anesthesia was also reviewed. The risks                            and benefits of the procedure and the sedation                            options and risks were discussed with the patient.                            All questions were answered, and informed consent                            was obtained. Prior Anticoagulants: The patient has                            taken no anticoagulant or antiplatelet agents. ASA                            Grade Assessment: II - A patient with mild systemic                            disease. After reviewing the risks and benefits,                            the patient was deemed in satisfactory condition to                            undergo the procedure.                           After obtaining informed consent, the colonoscope  was passed under direct vision. Throughout the                            procedure, the patient's blood pressure, pulse, and                            oxygen saturations were monitored continuously. The                            Olympus CF-HQ190L ([id number]) Colonoscope was                            introduced through the anus and advanced to the the                            terminal ileum, with  identification of the                            appendiceal orifice and IC valve. The colonoscopy                            was performed without difficulty. The patient                            tolerated the procedure well. The quality of the                            bowel preparation was adequate. The terminal ileum,                            ileocecal valve, appendiceal orifice, and rectum                            were photographed. Scope In: 1:35:39 PM Scope Out: 1:53:40 PM Scope Withdrawal Time: 0 hours 13 minutes 2 seconds  Total Procedure Duration: 0 hours 18 minutes 1 second  Findings:                 The perianal and digital rectal examinations were                            normal.                           The terminal ileum appeared normal.                           Many diverticula were found in the entire colon.                           A 4 mm polyp was found in the ascending colon. The                            polyp was sessile. The polyp was removed with a  cold snare. Resection and retrieval were complete.                           Two sessile polyps were found in the transverse                            colon. The polyps were 4 mm in size. These polyps                            were removed with a cold snare. Resection and                            retrieval were complete.                           A 3 mm polyp was found in the descending colon. The                            polyp was sessile. The polyp was removed with a                            cold snare. Resection and retrieval were complete.                           The colon was long / redundant, with looping.                           Internal hemorrhoids were found during retroflexion.                           The exam was otherwise without abnormality. Complications:            No immediate complications. Estimated blood loss:                             Minimal. Estimated Blood Loss:     Estimated blood loss was minimal. Impression:               - The examined portion of the ileum was normal.                           - Diverticulosis in the entire examined colon.                           - One 4 mm polyp in the ascending colon, removed                            with a cold snare. Resected and retrieved.                           - Two 4 mm polyps in the transverse colon, removed                            with a cold  snare. Resected and retrieved.                           - One 3 mm polyp in the descending colon, removed                            with a cold snare. Resected and retrieved.                           - Long and redundant colon.                           - Internal hemorrhoids.                           - The examination was otherwise normal.                           No cause for iron deficiency on colonoscopy. Recommendation:           - Patient has a contact number available for                            emergencies. The signs and symptoms of potential                            delayed complications were discussed with the                            patient. Return to normal activities tomorrow.                            Written discharge instructions were provided to the                            patient.                           - Resume previous diet.                           - Continue present medications.                           - Await pathology results. Elspeth P. Jacobs Golab, MD 02/20/2024 1:58:57 PM This report has been signed electronically.

## 2024-02-20 NOTE — Progress Notes (Signed)
 [facility] History and Physical   Primary Care Physician:  Remonia Alm PARAS, MD   Reason for Procedure:   IDA  Plan:    EGD and colonoscopy     HPI: Steven [name] is a 73 y.o. male  here for EGD and colonoscopy for history fo iron deficiency anemia. Seen on 01/07/24 - no interval changes. Otherwise feels well without any cardiopulmonary symptoms.   I have discussed risks / benefits of anesthesia and endoscopic procedure with Steven [name] and they wish to proceed with the exams as outlined today.    Past Medical History:  Diagnosis Date   Allergy infant reaction   caused liver issues   Anxiety 1980   Military service   Celiac artery aneurysm    GERD with stricture    Hyperlipidemia    Hypertension    IDA (iron deficiency anemia)    Microscopic colitis    Situational stress    Skin cancer     Past Surgical History:  Procedure Laterality Date   cardiolite stresstest  fall 2010   COLONOSCOPY  fall 2010   COSMETIC SURGERY  2022   HAND RECONSTRUCTION Left    MENISCUS REPAIR Left    UPPER GASTROINTESTINAL ENDOSCOPY  fall 2010    Prior to Admission medications   Medication Sig Start Date End Date Taking? Authorizing Provider  Coenzyme Q10 60 MG CAPS Take by mouth.   Yes [provider]  Multiple Vitamin (MULTIVITAMIN) tablet Take 1 tablet by mouth daily.   Yes [provider]  olmesartan (BENICAR) 20 MG tablet Take 20 mg by mouth daily. 07/29/19  Yes [provider]  omeprazole  (PRILOSEC) 40 MG capsule TAKE 1 CAPSULE BY MOUTH EVERY DAY 09/27/16  Yes [name], MD  rosuvastatin (CRESTOR) 20 MG tablet Take 20 mg by mouth at bedtime.   Yes [provider]  sertraline  (ZOLOFT ) 50 MG tablet Take 1 tablet (50 mg total) by mouth daily. 09/27/16  Yes [name], MD  VASCEPA 1 g capsule  06/22/19  Yes [provider]  triazolam  (HALCION ) 0.25 MG tablet Take 1 tablet (0.25 mg total) by mouth at bedtime  as needed. 09/12/17   [name], MD  XYOSTED 50 MG/0.5ML SOAJ Inject 0.5 mLs into the skin once a week. 06/14/21 01/23/24  [provider]    Current Outpatient Medications  Medication Sig Dispense Refill   Coenzyme Q10 60 MG CAPS Take by mouth.     Multiple Vitamin (MULTIVITAMIN) tablet Take 1 tablet by mouth daily.     olmesartan (BENICAR) 20 MG tablet Take 20 mg by mouth daily.     omeprazole  (PRILOSEC) 40 MG capsule TAKE 1 CAPSULE BY MOUTH EVERY DAY 90 capsule 2   rosuvastatin (CRESTOR) 20 MG tablet Take 20 mg by mouth at bedtime.     sertraline  (ZOLOFT ) 50 MG tablet Take 1 tablet (50 mg total) by mouth daily. 90 tablet 2   VASCEPA 1 g capsule      triazolam  (HALCION ) 0.25 MG tablet Take 1 tablet (0.25 mg total) by mouth at bedtime as needed. 90 tablet 2   XYOSTED 50 MG/0.5ML SOAJ Inject 0.5 mLs into the skin once a week.     Current Facility-Administered Medications  Medication Dose Route Frequency Provider Last Rate Last Admin   0.9 %  sodium chloride infusion  500 mL Intravenous Continuous Modupe Shampine, Elspeth SQUIBB, MD        Allergies as of 02/20/2024 - Review  Complete 02/20/2024  Allergen Reaction Noted   Penicillins Anaphylaxis 08/25/2009   Sulfa  antibiotics Rash 10/12/2012    Family History  Problem Relation Age of Onset   Cancer Mother        on aorta   Lymphoma Father    Hypertension Father    Cancer Father    Coronary artery disease Sister    Hyperlipidemia Sister    Bone cancer Maternal Grandfather    Cancer Paternal Grandfather        type unknown    Social History   Socioeconomic History   Marital status: Married    Spouse name: Not on file   Number of children: 2   Years of education: Not on file   Highest education level: Master's degree (e.g., MA, MS, MEng, MEd, MSW, MBA)  Occupational History   Not on file  Tobacco Use   Smoking status: Never   Smokeless tobacco: Never  Vaping Use   Vaping status: Never Used  Substance and  Sexual Activity   Alcohol use: Yes    Alcohol/week: 2.0 standard drinks of alcohol    Types: 2 Glasses of wine per week    Comment: a glass of wine once a week   Drug use: No   Sexual activity: Yes    Birth control/protection: None  Other Topics Concern   Not on file  Social History Narrative   Not on file   Social Drivers of Health   Financial Resource Strain: Low Risk  (06/04/2023)   Received from [facility]   Overall Financial Resource Strain (CARDIA)    Difficulty of Paying Living Expenses: Not hard at all  Food Insecurity: No Food Insecurity (01/06/2024)   Hunger Vital Sign    Worried About Running Out of Food in the Last Year: Never true    Ran Out of Food in the Last Year: Never true  Transportation Needs: No Transportation Needs (01/06/2024)   PRAPARE - Administrator, Civil Service (Medical): No    Lack of Transportation (Non-Medical): No  Physical Activity: Sufficiently Active (01/06/2024)   Exercise Vital Sign    Days of Exercise per Week: 5 days    Minutes of Exercise per Session: 30 min  Stress: Stress Concern Present (01/06/2024)   [employer] of Occupational Health - Occupational Stress Questionnaire    Feeling of Stress: To some extent  Social Connections: Unknown (01/06/2024)   Social Connection and Isolation Panel    Frequency of Communication with Friends and Family: Twice a week    Frequency of Social Gatherings with Friends and Family: Once a week    Attends Religious Services: Not on Marketing executive or Organizations: Yes    Attends Banker Meetings: Never    Marital Status: Married  Catering manager Violence: Not At Risk (06/04/2023)   Received from [facility]   HITS    Over the last 12 months how often did your partner physically hurt you?: Never    Over the last 12 months how often did your partner insult you or talk down to you?: Never    Over the last 12 months how often did your partner threaten you with  physical harm?: Never    Over the last 12 months how often did your partner scream or curse at you?: Never    Review of Systems: All other review of systems negative except as mentioned in the HPI.  Physical Exam: Vital signs BP ROLLEN)  151/79   Pulse 63   Temp (!) 97 F (36.1 C)   Resp 16   Ht 5' 8 (1.727 m)   Wt 195 lb (88.5 kg)   SpO2 99%   BMI 29.65 kg/m   General:   Alert,  Well-developed, pleasant and cooperative in NAD Lungs:  Clear throughout to auscultation.   Heart:  Regular rate and rhythm Abdomen:  Soft, nontender and nondistended.   Neuro/Psych:  Alert and cooperative. Normal mood and affect. A and O x 3  Marcey Naval, MD [facility]

## 2024-02-20 NOTE — Progress Notes (Signed)
 Called to room to assist during endoscopic procedure.  Patient ID and intended procedure confirmed with present staff. Received instructions for my participation in the procedure from the performing physician.

## 2024-02-20 NOTE — Op Note (Signed)
 [facility] Patient Name: Steven [name] Procedure Date: 02/20/2024 1:21 PM MRN: [id number] Endoscopist: Elspeth P. Leigh , MD, [id number] Age: 73 Referring MD:  Date of Birth: Apr 15, 1951 Gender: Male Account #: [id number] Procedure:                Upper GI endoscopy Indications:              Iron deficiency anemia, Follow-up of                            gastro-esophageal reflux disease on omeprazole  Medicines:                Monitored Anesthesia Care Procedure:                Pre-Anesthesia Assessment:                           - Prior to the procedure, a History and Physical                            was performed, and patient medications and                            allergies were reviewed. The patient's tolerance of                            previous anesthesia was also reviewed. The risks                            and benefits of the procedure and the sedation                            options and risks were discussed with the patient.                            All questions were answered, and informed consent                            was obtained. Prior Anticoagulants: The patient has                            taken no anticoagulant or antiplatelet agents. ASA                            Grade Assessment: II - A patient with mild systemic                            disease. After reviewing the risks and benefits,                            the patient was deemed in satisfactory condition to                            undergo the procedure.  After obtaining informed consent, the endoscope was                            passed under direct vision. Throughout the                            procedure, the patient's blood pressure, pulse, and                            oxygen saturations were monitored continuously. The                            Olympus scope [id number] was introduced through the                            mouth, and  advanced to the second part of duodenum.                            The upper GI endoscopy was accomplished without                            difficulty. The patient tolerated the procedure                            well. Scope In: Scope Out: Findings:                 Esophagogastric landmarks were identified: the                            Z-line was found at 36 cm, the gastroesophageal                            junction was found at 36 cm and the upper extent of                            the gastric folds was found at 41 cm from the                            incisors.                           A 5 cm hiatal hernia was present. No obvious                            Cameron lesions noted.                           The examined esophagus was tortuous.                           The exam of the esophagus was otherwise normal.                           A few sessile polyps were found in  the gastric                            fundus and in the gastric body. Benign appearing,                            suspect fundic gland polyps, with one benign                            lymphangiectasia. Biopsies were taken with a cold                            forceps for histology.                           Striped moderately erythematous mucosa was found in                            the gastric antrum.                           The exam of the stomach was otherwise normal.                           Biopsies were taken with a cold forceps for                            Helicobacter pylori testing.                           The examined duodenum was normal. Complications:            No immediate complications. Estimated blood loss:                            Minimal. Estimated Blood Loss:     Estimated blood loss was minimal. Impression:               - Esophagogastric landmarks identified.                           - 5 cm hiatal hernia.                           - Tortuous esophagus.                            - A few gastric polyps. Biopsied. 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                           - Erythematous mucosa in the antrum.                           - Normal stomach otherwise - biopsies taken to rule  out H pylori.                           - Normal examined duodenum.                           Possible IDA could be related to hiatal hernia, but                            no obvious cameron lesions noted. Recommendation:           - Patient has a contact number available for                            emergencies. The signs and symptoms of potential                            delayed complications were discussed with the                            patient. Return to normal activities tomorrow.                            Written discharge instructions were provided to the                            patient.                           - Resume previous diet.                           - Continue present medications.                           - Await pathology results.                           - Await course on iron and monitoring Hgb. Capsule                            endoscopy if IDA persists Steven [name] P. Steven Delapena, MD 02/20/2024 2:02:21 PM This report has been signed electronically.

## 2024-02-20 NOTE — Progress Notes (Signed)
 Report given to PACU, vss

## 2024-02-20 NOTE — Progress Notes (Signed)
 Pt's states no medical or surgical changes since previsit or office visit.

## 2024-02-21 ENCOUNTER — Telehealth: Payer: Self-pay | Admitting: *Deleted

## 2024-02-21 LAB — GENECONNECT MOLECULAR SCREEN: Genetic Analysis Overall Interpretation: NEGATIVE

## 2024-02-21 NOTE — Telephone Encounter (Signed)
  Follow up Call-     02/20/2024   12:43 PM  Call back number  Post procedure Call Back phone  # [phone]  Permission to leave phone message Yes     Patient questions:  Message left to call us  if necessary.

## 2024-02-25 LAB — SURGICAL PATHOLOGY

## 2024-02-26 ENCOUNTER — Ambulatory Visit: Payer: Self-pay | Admitting: Gastroenterology

## 2024-02-27 NOTE — Progress Notes (Signed)
 Steven Heads MD Reason for referral-Coronary calcification  HPI: 73 yo male for evaluation of coronary calcification at request of Steven Heads MD. 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 Ca score 10/25 1663/90 percentile; dilated thoracic ascending aorta (4.6 x 4.7 cm); RLL nodules. Cardiology now asked to evaluate.  Patient has dyspnea with more vigorous activities but not routine activities.  No orthopnea, PND, pedal edema, chest pain, palpitations or syncope.  Current Outpatient Medications  Medication Sig Dispense Refill   Coenzyme Q10 60 MG CAPS Take by mouth.     Multiple Vitamin (MULTIVITAMIN) tablet Take 1 tablet by mouth daily.     olmesartan (BENICAR) 20 MG tablet Take 20 mg by mouth daily.     omeprazole  (PRILOSEC) 40 MG capsule TAKE 1 CAPSULE BY MOUTH EVERY DAY 90 capsule 2   rosuvastatin (CRESTOR) 20 MG tablet Take 20 mg by mouth at bedtime.     sertraline  (ZOLOFT ) 50 MG tablet Take 1 tablet (50 mg total) by mouth daily. 90 tablet 2   tirzepatide (ZEPBOUND) 5 MG/0.5ML injection vial Inject into the skin once a week.     triazolam  (HALCION ) 0.25 MG tablet Take 1 tablet (0.25 mg total) by mouth at bedtime as needed. 90 tablet 2   VASCEPA 1 g capsule      XYOSTED 50 MG/0.5ML SOAJ Inject 0.5 mLs into the skin once a week.     No current facility-administered medications for this visit.    Allergies  Allergen Reactions   Penicillins Anaphylaxis    Other Reaction(s): Eruption of skin   Sulfa  Antibiotics Rash     Past Medical History:  Diagnosis Date   Allergy infant reaction   caused liver issues   Anxiety 1980   Military service   Celiac artery aneurysm    GERD with stricture    Hyperlipidemia    Hypertension    IDA (iron deficiency anemia)    Microscopic colitis    Situational stress    Skin cancer     Past Surgical History:   Procedure Laterality Date   cardiolite stresstest  fall 2010   COLONOSCOPY  fall 2010   COSMETIC SURGERY  2022   HAND RECONSTRUCTION Left    MENISCUS REPAIR Left    UPPER GASTROINTESTINAL ENDOSCOPY  fall 2010    Social History   Socioeconomic History   Marital status: Married    Spouse name: Not on file   Number of children: 2   Years of education: Not on file   Highest education level: Master's degree (e.g., MA, MS, MEng, MEd, MSW, MBA)  Occupational History   Not on file  Tobacco Use   Smoking status: Never   Smokeless tobacco: Never  Vaping Use   Vaping status: Never Used  Substance and Sexual Activity   Alcohol use: Yes    Alcohol/week: 2.0 standard drinks of alcohol    Types: 2 Glasses of wine per week    Comment: a glass of wine once a week   Drug use: No   Sexual activity: Yes    Birth control/protection: None  Other Topics Concern   Not on file  Social History Narrative   Not on file   Social Drivers of Health   Financial Resource Strain: Low Risk  (06/04/2023)   Received from [facility]   Overall Financial Resource Strain (CARDIA)    Difficulty of  Paying Living Expenses: Not hard at all  Food Insecurity: No Food Insecurity (01/06/2024)   Hunger Vital Sign    Worried About Running Out of Food in the Last Year: Never true    Ran Out of Food in the Last Year: Never true  Transportation Needs: No Transportation Needs (01/06/2024)   PRAPARE - Administrator, Civil Service (Medical): No    Lack of Transportation (Non-Medical): No  Physical Activity: Sufficiently Active (01/06/2024)   Exercise Vital Sign    Days of Exercise per Week: 5 days    Minutes of Exercise per Session: 30 min  Stress: Stress Concern Present (01/06/2024)   [employer] of Occupational Health - Occupational Stress Questionnaire    Feeling of Stress: To some extent  Social Connections: Unknown (01/06/2024)   Social Connection and Isolation Panel    Frequency of Communication  with Friends and Family: Twice a week    Frequency of Social Gatherings with Friends and Family: Once a week    Attends Religious Services: Not on Marketing Executive or Organizations: Yes    Attends Banker Meetings: Never    Marital Status: Married  Catering Manager Violence: Not At Risk (06/04/2023)   Received from [facility]   HITS    Over the last 12 months how often did your partner physically hurt you?: Never    Over the last 12 months how often did your partner insult you or talk down to you?: Never    Over the last 12 months how often did your partner threaten you with physical harm?: Never    Over the last 12 months how often did your partner scream or curse at you?: Never    Family History  Problem Relation Age of Onset   Cancer Mother        on aorta   Lymphoma Father    Hypertension Father    Cancer Father    Coronary artery disease Sister    Hyperlipidemia Sister    Bone cancer Maternal Grandfather    Cancer Paternal Grandfather        type unknown    ROS: no fevers or chills, productive cough, hemoptysis, dysphasia, odynophagia, melena, hematochezia, dysuria, hematuria, rash, seizure activity, orthopnea, PND, pedal edema, claudication. Remaining systems are negative.  Physical Exam:   Blood pressure 127/77, pulse (!) 58, height 5' 8 (1.727 m), weight 181 lb (82.1 kg), SpO2 96%.  General:  Well developed/well nourished in NAD Skin warm/dry Patient not depressed No peripheral clubbing Back-normal HEENT-normal/normal eyelids Neck supple/normal carotid upstroke bilaterally; no bruits; no JVD; no thyromegaly chest - CTA/ normal expansion CV - RRR/normal S1 and S2; no murmurs, rubs or gallops;  PMI nondisplaced Abdomen -NT/ND, no HSM, no mass, + bowel sounds, no bruit 2+ femoral pulses, no bruits Ext-no edema, chords, 2+ DP Neuro-grossly nonfocal  EKG Interpretation Date/Time:  Monday March 09 2024 10:58:30 EST Ventricular Rate:   58 PR Interval:    QRS Duration:  100 QT Interval:  398 QTC Calculation: 390 R Axis:   -28  Text Interpretation: Sinus bradycardia Non-specific ST-t changes Confirmed by Steven [name] ([id number]) on 03/09/2024 11:00:04 AM    A/P  1 Coronary calcification-pt denies CP; schedule stress nuclear study to screen for ischemia; continue ASA and statin.  2 hyperlipidemia-given elevated Ca score, increase crestor to 40 mg daily; check LP(a), lipids and liver 8 weeks; goal LDL < 55.  3 Hypertension-BP controlled; continue present  meds.  4 celiac aneurysm-followed by vascular surgery.   5 RLL pulmonary nodules-FU primary care.   6 thoracic aortic aneurysm-ascending aorta dilated on recent CT.  Will arrange CTA to better assess April 2026.  Steven Shallow, MD

## 2024-03-09 ENCOUNTER — Ambulatory Visit (INDEPENDENT_AMBULATORY_CARE_PROVIDER_SITE_OTHER): Admitting: Cardiology

## 2024-03-09 ENCOUNTER — Encounter: Payer: Self-pay | Admitting: *Deleted

## 2024-03-09 ENCOUNTER — Encounter: Payer: Self-pay | Admitting: Cardiology

## 2024-03-09 VITALS — BP 127/77 | HR 58 | Ht 68.0 in | Wt 181.0 lb

## 2024-03-09 DIAGNOSIS — E7800 Pure hypercholesterolemia, unspecified: Secondary | ICD-10-CM | POA: Diagnosis not present

## 2024-03-09 DIAGNOSIS — I10 Essential (primary) hypertension: Secondary | ICD-10-CM

## 2024-03-09 DIAGNOSIS — I712 Thoracic aortic aneurysm, without rupture, unspecified: Secondary | ICD-10-CM

## 2024-03-09 DIAGNOSIS — I2510 Atherosclerotic heart disease of native coronary artery without angina pectoris: Secondary | ICD-10-CM | POA: Diagnosis not present

## 2024-03-09 MED ORDER — ROSUVASTATIN CALCIUM 40 MG PO TABS: 40.0000 mg | ORAL_TABLET | Freq: Every day | ORAL | 3 refills | Status: AC

## 2024-03-09 NOTE — Patient Instructions (Signed)
 Medication Instructions:   INCREASE ROSUVASTATIN TO 40 MG ONCE DAILY= 2 OF THE 20 MG TABLETS ONCE DAILY  *If you need a refill on your cardiac medications before your next appointment, please call your pharmacy*  Lab Work:  Your physician recommends that you return for lab work in: [facility]  If you have labs (blood work) drawn today and your tests are completely normal, you will receive your results only by: MyChart Message (if you have MyChart) OR A paper copy in the mail If you have any lab test that is abnormal or we need to change your treatment, we will call you to review the results.  Testing/Procedures:  Your physician has requested that you have en exercise stress myoview. For further information please visit [email or web address] Please follow instruction sheet, as given. [address]-  Follow-Up: At [facility], you and your health needs are our priority.  As part of our continuing mission to provide you with exceptional heart care, our providers are all part of one team.  This team includes your primary Cardiologist (physician) and Advanced Practice Providers or APPs (Physician Assistants and Nurse Practitioners) who all work together to provide you with the care you need, when you need it.  Your next appointment:   12 month(s)  Provider:   REDELL SHALLOW MD

## 2024-03-11 ENCOUNTER — Other Ambulatory Visit: Payer: Self-pay | Admitting: Cardiology

## 2024-03-11 DIAGNOSIS — I2510 Atherosclerotic heart disease of native coronary artery without angina pectoris: Secondary | ICD-10-CM

## 2024-03-13 ENCOUNTER — Ambulatory Visit (HOSPITAL_COMMUNITY)
Admission: RE | Admit: 2024-03-13 | Discharge: 2024-03-13 | Disposition: A | Discharge status: Home / Self Care | Source: Ambulatory Visit | Attending: Cardiology | Admitting: Cardiology

## 2024-03-13 DIAGNOSIS — I2510 Atherosclerotic heart disease of native coronary artery without angina pectoris: Secondary | ICD-10-CM | POA: Diagnosis present

## 2024-03-13 LAB — MYOCARDIAL PERFUSION IMAGING
Angina Index: 0
Duke Treadmill Score: 6
Estimated workload: 7.3
Exercise duration (min): 6 min
Exercise duration (sec): 15 s
LV dias vol: 132 mL (ref 62–150)
LV sys vol: 77 mL (ref 4.2–5.8)
MPHR: 148 {beats}/min
Nuc Stress EF: 55 %
Peak HR: 146 {beats}/min
Percent HR: 98 %
Rest HR: 61 {beats}/min
Rest Nuclear Isotope Dose: 10.9 mCi
SDS: 0
SRS: 2
SSS: 0
ST Depression (mm): 0 mm
Stress Nuclear Isotope Dose: 32.4 mCi
TID: 0.93

## 2024-03-13 MED ORDER — TECHNETIUM TC 99M TETROFOSMIN IV KIT
30.8000 | PACK | Freq: Once | INTRAVENOUS | Status: AC | PRN
  Administered 2024-03-13: 30.8 via INTRAVENOUS

## 2024-03-13 MED ORDER — TECHNETIUM TC 99M TETROFOSMIN IV KIT
10.9000 | PACK | Freq: Once | INTRAVENOUS | Status: AC | PRN
  Administered 2024-03-13: 10.9 via INTRAVENOUS

## 2024-03-14 ENCOUNTER — Ambulatory Visit: Payer: Self-pay | Admitting: Cardiology

## 2024-03-16 ENCOUNTER — Encounter: Payer: Self-pay | Admitting: Cardiology

## 2024-03-23 ENCOUNTER — Ambulatory Visit: Payer: Self-pay | Admitting: Cardiology

## 2024-03-23 DIAGNOSIS — E7800 Pure hypercholesterolemia, unspecified: Secondary | ICD-10-CM

## 2024-03-23 DIAGNOSIS — I712 Thoracic aortic aneurysm, without rupture, unspecified: Secondary | ICD-10-CM

## 2024-03-23 LAB — LIPID PANEL
Chol/HDL Ratio: 2.5 ratio (ref 0.0–5.0)
Cholesterol, Total: 121 mg/dL (ref 100–199)
HDL: 48 mg/dL (ref >39)
LDL Chol Calc (NIH): 59 mg/dL (ref 0–99)
Triglycerides: 68 mg/dL (ref 0–149)
VLDL Cholesterol Cal: 14 mg/dL (ref 5–40)

## 2024-03-23 LAB — HEPATIC FUNCTION PANEL
ALT: 20 IU/L (ref 0–44)
AST: 20 IU/L (ref 0–40)
Albumin: 4.5 g/dL (ref 3.8–4.8)
Alkaline Phosphatase: 63 IU/L (ref 47–123)
Bilirubin Total: 0.4 mg/dL (ref 0.0–1.2)
Bilirubin, Direct: 0.15 mg/dL (ref 0.00–0.40)
Total Protein: 6.3 g/dL (ref 6.0–8.5)

## 2024-03-23 LAB — LIPOPROTEIN A (LPA): Lipoprotein (a): <8.4 nmol/L (ref <75.0)

## 2024-04-01 ENCOUNTER — Telehealth: Payer: Self-pay

## 2024-04-01 DIAGNOSIS — D509 Iron deficiency anemia, unspecified: Secondary | ICD-10-CM

## 2024-04-01 NOTE — Telephone Encounter (Signed)
-----   Message from [facility] sent at 02/26/2024 10:13 AM EDT ----- Regarding: due for labs CBC/TIBC ferritin panel in 6 weeks = Dec 10

## 2024-04-01 NOTE — Telephone Encounter (Signed)
 Orders entered. MyChart message to patient to go to the lab one day next week

## 2024-04-03 ENCOUNTER — Other Ambulatory Visit: Payer: Self-pay | Admitting: Family Medicine

## 2024-04-03 DIAGNOSIS — I2510 Atherosclerotic heart disease of native coronary artery without angina pectoris: Secondary | ICD-10-CM

## 2024-04-03 NOTE — Progress Notes (Signed)
 Moderate ASCVD on CAC.  Carotid duplex recommended.

## 2024-04-10 ENCOUNTER — Ambulatory Visit: Admitting: Student in an Organized Health Care Education/Training Program

## 2024-04-13 ENCOUNTER — Ambulatory Visit
Admission: RE | Admit: 2024-04-13 | Discharge: 2024-04-13 | Disposition: A | Source: Ambulatory Visit | Attending: Family Medicine | Admitting: Family Medicine

## 2024-04-13 DIAGNOSIS — I2510 Atherosclerotic heart disease of native coronary artery without angina pectoris: Secondary | ICD-10-CM

## 2024-05-19 ENCOUNTER — Encounter: Payer: Self-pay | Admitting: Cardiology

## 2024-05-21 LAB — HEPATIC FUNCTION PANEL
ALT: 25 IU/L (ref 0–44)
AST: 22 IU/L (ref 0–40)
Albumin: 5 g/dL — ABNORMAL HIGH (ref 3.8–4.8)
Alkaline Phosphatase: 80 IU/L (ref 47–123)
Bilirubin Total: 0.7 mg/dL (ref 0.0–1.2)
Bilirubin, Direct: 0.25 mg/dL (ref 0.00–0.40)
Total Protein: 7.4 g/dL (ref 6.0–8.5)

## 2024-05-21 LAB — LIPID PANEL
Chol/HDL Ratio: 2.8 ratio (ref 0.0–5.0)
Cholesterol, Total: 160 mg/dL (ref 100–199)
HDL: 57 mg/dL
LDL Chol Calc (NIH): 80 mg/dL (ref 0–99)
Triglycerides: 132 mg/dL (ref 0–149)
VLDL Cholesterol Cal: 23 mg/dL (ref 5–40)

## 2024-05-21 LAB — LIPOPROTEIN A (LPA): Lipoprotein (a): 24 nmol/L

## 2024-05-27 NOTE — Telephone Encounter (Addendum)
 Mr Glick, I will send your question to the pharmacist here in our office to make sure there will be no problem.

## 2024-05-28 ENCOUNTER — Encounter: Payer: Self-pay | Admitting: Pharmacist

## 2024-05-29 ENCOUNTER — Telehealth: Payer: Self-pay | Admitting: Pharmacy Technician

## 2024-05-29 ENCOUNTER — Other Ambulatory Visit (HOSPITAL_COMMUNITY): Payer: Self-pay

## 2024-05-29 MED ORDER — REPATHA SURECLICK 140 MG/ML ~~LOC~~ SOAJ: 1.0000 mL | SUBCUTANEOUS | 3 refills | Status: AC

## 2024-05-29 NOTE — Telephone Encounter (Signed)
" ° °  He has two insurances. The advance prescrip with id: [id number] does not require a prior authorization and cost is free. They have his dob as [birth year].    The other we are unable to run a test claim at our pharmacy-says not contracted "

## 2024-05-29 NOTE — Addendum Note (Signed)
 Addended by: Takia Runyon D on: 05/29/2024 11:23 AM   Modules accepted: Orders

## 2024-07-29 ENCOUNTER — Other Ambulatory Visit (HOSPITAL_COMMUNITY)
